# Patient Record
Sex: Female | Born: 1990
Health system: Southern US, Community
[De-identification: ages and names within clinical notes are randomized; demographics above are authoritative.]

## PROBLEM LIST (undated history)

## (undated) ENCOUNTER — Inpatient Hospital Stay: Payer: Self-pay

## (undated) DIAGNOSIS — F17201 Nicotine dependence, unspecified, in remission: Secondary | ICD-10-CM

## (undated) DIAGNOSIS — M419 Scoliosis, unspecified: Secondary | ICD-10-CM

## (undated) DIAGNOSIS — Z8619 Personal history of other infectious and parasitic diseases: Secondary | ICD-10-CM

## (undated) HISTORY — DX: Personal history of other infectious and parasitic diseases: Z86.19

## (undated) HISTORY — DX: Nicotine dependence, unspecified, in remission: F17.201

## (undated) HISTORY — PX: REFRACTIVE SURGERY: SHX103

## (undated) NOTE — *Deleted (*Deleted)
Preventive Care 21-39 Years Old, Female Preventive care refers to visits with your health care provider and lifestyle choices that can promote health and wellness. This includes:  A yearly physical exam. This may also be called an annual well check.  Regular dental visits and eye exams.  Immunizations.  Screening for certain conditions.  Healthy lifestyle choices, such as eating a healthy diet, getting regular exercise, not using drugs or products that contain nicotine and tobacco, and limiting alcohol use. What can I expect for my preventive care visit? Physical exam Your health care provider will check your:  Height and weight. This may be used to calculate body mass index (BMI), which tells if you are at a healthy weight.  Heart rate and blood pressure.  Skin for abnormal spots. Counseling Your health care provider may ask you questions about your:  Alcohol, tobacco, and drug use.  Emotional well-being.  Home and relationship well-being.  Sexual activity.  Eating habits.  Work and work environment.  Method of birth control.  Menstrual cycle.  Pregnancy history. What immunizations do I need?  Influenza (flu) vaccine  This is recommended every year. Tetanus, diphtheria, and pertussis (Tdap) vaccine  You may need a Td booster every 10 years. Varicella (chickenpox) vaccine  You may need this if you have not been vaccinated. Human papillomavirus (HPV) vaccine  If recommended by your health care provider, you may need three doses over 6 months. Measles, mumps, and rubella (MMR) vaccine  You may need at least one dose of MMR. You may also need a second dose. Meningococcal conjugate (MenACWY) vaccine  One dose is recommended if you are age 19-21 years and a first-year college student living in a residence hall, or if you have one of several medical conditions. You may also need additional booster doses. Pneumococcal conjugate (PCV13) vaccine  You may need  this if you have certain conditions and were not previously vaccinated. Pneumococcal polysaccharide (PPSV23) vaccine  You may need one or two doses if you smoke cigarettes or if you have certain conditions. Hepatitis A vaccine  You may need this if you have certain conditions or if you travel or work in places where you may be exposed to hepatitis A. Hepatitis B vaccine  You may need this if you have certain conditions or if you travel or work in places where you may be exposed to hepatitis B. Haemophilus influenzae type b (Hib) vaccine  You may need this if you have certain conditions. You may receive vaccines as individual doses or as more than one vaccine together in one shot (combination vaccines). Talk with your health care provider about the risks and benefits of combination vaccines. What tests do I need?  Blood tests  Lipid and cholesterol levels. These may be checked every 5 years starting at age 20.  Hepatitis C test.  Hepatitis B test. Screening  Diabetes screening. This is done by checking your blood sugar (glucose) after you have not eaten for a while (fasting).  Sexually transmitted disease (STD) testing.  BRCA-related cancer screening. This may be done if you have a family history of breast, ovarian, tubal, or peritoneal cancers.  Pelvic exam and Pap test. This may be done every 3 years starting at age 21. Starting at age 30, this may be done every 5 years if you have a Pap test in combination with an HPV test. Talk with your health care provider about your test results, treatment options, and if necessary, the need for more tests.   Follow these instructions at home: Eating and drinking   Eat a diet that includes fresh fruits and vegetables, whole grains, lean protein, and low-fat dairy.  Take vitamin and mineral supplements as recommended by your health care provider.  Do not drink alcohol if: ? Your health care provider tells you not to drink. ? You are  pregnant, may be pregnant, or are planning to become pregnant.  If you drink alcohol: ? Limit how much you have to 0-1 drink a day. ? Be aware of how much alcohol is in your drink. In the [place] one drink equals one 12 oz bottle of beer (355 mL), one 5 oz glass of wine (148 mL), or one 1 oz glass of hard liquor (44 mL). Lifestyle  Take daily care of your teeth and gums.  Stay active. Exercise for at least 30 minutes on 5 or more days each week.  Do not use any products that contain nicotine or tobacco, such as cigarettes, e-cigarettes, and chewing tobacco. If you need help quitting, ask your health care provider.  If you are sexually active, practice safe sex. Use a condom or other form of birth control (contraception) in order to prevent pregnancy and STIs (sexually transmitted infections). If you plan to become pregnant, see your health care provider for a preconception visit. What's next?  Visit your health care provider once a year for a well check visit.  Ask your health care provider how often you should have your eyes and teeth checked.  Stay up to date on all vaccines. This information is not intended to replace advice given to you by your health care provider. Make sure you discuss any questions you have with your health care provider. Document Revised: 11/19/2017 Document Reviewed: 11/19/2017 Elsevier Patient Education  2020 [employer]. Breast Self-Awareness Breast self-awareness is knowing how your breasts look and feel. Doing breast self-awareness is important. It allows you to catch a breast problem early while it is still small and can be treated. All women should do breast self-awareness, including women who have had breast implants. Tell your doctor if you notice a change in your breasts. What you need:  A mirror.  A well-lit room. How to do a breast self-exam A breast self-exam is one way to learn what is normal for your breasts and to check for changes. To do a  breast self-exam: Look for changes  1. Take off all the clothes above your waist. 2. Stand in front of a mirror in a room with good lighting. 3. Put your hands on your hips. 4. Push your hands down. 5. Look at your breasts and nipples in the mirror to see if one breast or nipple looks different from the other. Check to see if: ? The shape of one breast is different. ? The size of one breast is different. ? There are wrinkles, dips, and bumps in one breast and not the other. 6. Look at each breast for changes in the skin, such as: ? Redness. ? Scaly areas. 7. Look for changes in your nipples, such as: ? Liquid around the nipples. ? Bleeding. ? Dimpling. ? Redness. ? A change in where the nipples are. Feel for changes  1. Lie on your back on the floor. 2. Feel each breast. To do this, follow these steps: ? Pick a breast to feel. ? Put the arm closest to that breast above your head. ? Use your other arm to feel the nipple area of your breast. Feel   the area with the pads of your three middle fingers by making small circles with your fingers. For the first circle, press lightly. For the second circle, press harder. For the third circle, press even harder. ? Keep making circles with your fingers at the different pressures as you move down your breast. Stop when you feel your ribs. ? Move your fingers a little toward the center of your body. ? Start making circles with your fingers again, this time going up until you reach your collarbone. ? Keep making up-and-down circles until you reach your armpit. Remember to keep using the three pressures. ? Feel the other breast in the same way. 3. Sit or stand in the tub or shower. 4. With soapy water on your skin, feel each breast the same way you did in step 2 when you were lying on the floor. Write down what you find Writing down what you find can help you remember what to tell your doctor. Write down:  What is normal for each breast.  Any  changes you find in each breast, including: ? The kind of changes you find. ? Whether you have pain. ? Size and location of any lumps.  When you last had your menstrual period. General tips  Check your breasts every month.  If you are breastfeeding, the best time to check your breasts is after you feed your baby or after you use a breast pump.  If you get menstrual periods, the best time to check your breasts is 5-7 days after your menstrual period is over.  With time, you will become comfortable with the self-exam, and you will begin to know if there are changes in your breasts. Contact a doctor if you:  See a change in the shape or size of your breasts or nipples.  See a change in the skin of your breast or nipples, such as red or scaly skin.  Have fluid coming from your nipples that is not normal.  Find a lump or thick area that was not there before.  Have pain in your breasts.  Have any concerns about your breast health. Summary  Breast self-awareness includes looking for changes in your breasts, as well as feeling for changes within your breasts.  Breast self-awareness should be done in front of a mirror in a well-lit room.  You should check your breasts every month. If you get menstrual periods, the best time to check your breasts is 5-7 days after your menstrual period is over.  Let your doctor know of any changes you see in your breasts, including changes in size, changes on the skin, pain or tenderness, or fluid from your nipples that is not normal. This information is not intended to replace advice given to you by your health care provider. Make sure you discuss any questions you have with your health care provider. Document Revised: 10/27/2017 Document Reviewed: 10/27/2017 Elsevier Patient Education  2020 Elsevier Inc.  

---

## 2011-12-05 ENCOUNTER — Emergency Department: Payer: Self-pay | Admitting: Emergency Medicine

## 2013-07-08 ENCOUNTER — Other Ambulatory Visit: Payer: Self-pay | Admitting: Obstetrics and Gynecology

## 2013-07-08 LAB — HCG, QUANTITATIVE, PREGNANCY: Beta Hcg, Quant.: 16162 m[IU]/mL — ABNORMAL HIGH
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
## 2014-09-21 ENCOUNTER — Telehealth: Payer: Self-pay | Admitting: Obstetrics and Gynecology

## 2014-09-26 ENCOUNTER — Ambulatory Visit (INDEPENDENT_AMBULATORY_CARE_PROVIDER_SITE_OTHER): Payer: 59

## 2014-09-26 ENCOUNTER — Ambulatory Visit: Payer: 59

## 2014-09-26 VITALS — BP 114/79 | HR 92 | Ht 67.0 in | Wt 145.5 lb

## 2014-09-26 DIAGNOSIS — Z3687 Encounter for antenatal screening for uncertain dates: Secondary | ICD-10-CM

## 2014-09-26 DIAGNOSIS — Z720 Tobacco use: Secondary | ICD-10-CM

## 2014-09-26 DIAGNOSIS — Z36 Encounter for antenatal screening of mother: Secondary | ICD-10-CM | POA: Diagnosis not present

## 2014-09-26 DIAGNOSIS — Z3491 Encounter for supervision of normal pregnancy, unspecified, first trimester: Secondary | ICD-10-CM

## 2014-09-26 DIAGNOSIS — N96 Recurrent pregnancy loss: Secondary | ICD-10-CM

## 2014-09-26 LAB — OB RESULTS CONSOLE ABO/RH: RH Type: POSITIVE

## 2014-09-26 NOTE — Progress Notes (Signed)
Monica Wright for NOB nurse interview visit. lmp 08/19/14- (UNSURE) edd -05/26/2014- 5 2/7 G3  P0020-. Pregnancy education material explained and given. NOB labs ordered. HIV consent form signed. PNV encouraged. NT ordered. Pt. to follow up with provider in 5 weeks for NOB physical. Dating scan ordered d/t unsure of LMP. Pt has h/o of 2 sab. Tobacco user. Encouraged to quit. All questions answered.

## 2014-09-27 LAB — CBC WITH DIFFERENTIAL/PLATELET
Basophils Absolute: 0 10*3/uL (ref 0.0–0.2)
Basos: 0 %
EOS (ABSOLUTE): 0.1 10*3/uL (ref 0.0–0.4)
Eos: 1 %
Hematocrit: 40.7 % (ref 34.0–46.6)
Hemoglobin: 14.1 g/dL (ref 11.1–15.9)
Immature Grans (Abs): 0 10*3/uL (ref 0.0–0.1)
Immature Granulocytes: 0 %
Lymphocytes Absolute: 2.2 10*3/uL (ref 0.7–3.1)
Lymphs: 31 %
MCH: 31 pg (ref 26.6–33.0)
MCHC: 34.6 g/dL (ref 31.5–35.7)
MCV: 90 fL (ref 79–97)
Monocytes Absolute: 0.6 10*3/uL (ref 0.1–0.9)
Monocytes: 8 %
Neutrophils Absolute: 4.2 10*3/uL (ref 1.4–7.0)
Neutrophils: 60 %
Platelets: 284 10*3/uL (ref 150–379)
RBC: 4.55 x10E6/uL (ref 3.77–5.28)
RDW: 13.3 % (ref 12.3–15.4)
WBC: 7 10*3/uL (ref 3.4–10.8)

## 2014-09-27 LAB — HIV ANTIBODY (ROUTINE TESTING W REFLEX): HIV Screen 4th Generation wRfx: NONREACTIVE

## 2014-09-27 LAB — RPR: RPR Ser Ql: NONREACTIVE

## 2014-09-27 LAB — ABO AND RH: Rh Factor: POSITIVE

## 2014-09-27 LAB — URINE CULTURE

## 2014-09-27 LAB — ANTIBODY SCREEN: Antibody Screen: NEGATIVE

## 2014-09-27 LAB — RUBELLA SCREEN: Rubella Antibodies, IGG: 2.98 index (ref >0.99)

## 2014-09-27 LAB — URINALYSIS, ROUTINE W REFLEX MICROSCOPIC
Bilirubin, UA: NEGATIVE
Glucose, UA: NEGATIVE
Ketones, UA: NEGATIVE
Leukocytes, UA: NEGATIVE
Nitrite, UA: NEGATIVE
Protein, UA: NEGATIVE
RBC, UA: NEGATIVE
Specific Gravity, UA: 1.007 (ref 1.005–1.030)
Urobilinogen, Ur: 0.2 mg/dL (ref 0.2–1.0)
pH, UA: 6.5 (ref 5.0–7.5)

## 2014-09-27 LAB — HEPATITIS B SURFACE ANTIGEN: Hepatitis B Surface Ag: NEGATIVE

## 2014-09-27 LAB — VARICELLA ZOSTER ANTIBODY, IGG: Varicella zoster IgG: <135 index — ABNORMAL LOW (ref >165)

## 2014-09-28 LAB — GC/CHLAMYDIA PROBE AMP
Chlamydia trachomatis, NAA: POSITIVE — AB
Neisseria gonorrhoeae by PCR: NEGATIVE

## 2014-09-29 MED ORDER — AZITHROMYCIN 500 MG PO TABS: 1000.0000 mg | ORAL_TABLET | Freq: Once | ORAL | Status: DC

## 2014-09-29 NOTE — Telephone Encounter (Signed)
Pt notified and rx sent in 

## 2014-09-29 NOTE — Telephone Encounter (Signed)
-----   Message from Hildred LaserAnika Cherry, MD sent at 09/29/2014 12:39 PM EDT ----- Please inform patient of positive Chlamydia, and need for self and partner treatment.  Can prescribe Azithromycin 1 g (1 refill in case patient has vomiting).

## 2014-10-01 DIAGNOSIS — Z8619 Personal history of other infectious and parasitic diseases: Secondary | ICD-10-CM | POA: Insufficient documentation

## 2014-10-01 HISTORY — DX: Personal history of other infectious and parasitic diseases: Z86.19

## 2014-10-05 ENCOUNTER — Encounter: Payer: Self-pay | Admitting: Obstetrics and Gynecology

## 2014-10-31 ENCOUNTER — Encounter: Payer: 59 | Admitting: Obstetrics and Gynecology

## 2014-11-01 ENCOUNTER — Ambulatory Visit (INDEPENDENT_AMBULATORY_CARE_PROVIDER_SITE_OTHER): Payer: 59 | Admitting: Obstetrics and Gynecology

## 2014-11-01 ENCOUNTER — Encounter: Payer: Self-pay | Admitting: Obstetrics and Gynecology

## 2014-11-01 VITALS — BP 108/70 | HR 94 | Wt 147.4 lb

## 2014-11-01 DIAGNOSIS — O2620 Pregnancy care for patient with recurrent pregnancy loss, unspecified trimester: Secondary | ICD-10-CM | POA: Insufficient documentation

## 2014-11-01 DIAGNOSIS — O09291 Supervision of pregnancy with other poor reproductive or obstetric history, first trimester: Secondary | ICD-10-CM

## 2014-11-01 DIAGNOSIS — Z3491 Encounter for supervision of normal pregnancy, unspecified, first trimester: Secondary | ICD-10-CM

## 2014-11-01 DIAGNOSIS — A749 Chlamydial infection, unspecified: Secondary | ICD-10-CM

## 2014-11-01 DIAGNOSIS — O09891 Supervision of other high risk pregnancies, first trimester: Secondary | ICD-10-CM

## 2014-11-01 DIAGNOSIS — O2621 Pregnancy care for patient with recurrent pregnancy loss, first trimester: Secondary | ICD-10-CM

## 2014-11-01 LAB — POCT URINALYSIS DIPSTICK
Bilirubin, UA: NEGATIVE
Blood, UA: NEGATIVE
Glucose, UA: NEGATIVE
Ketones, UA: NEGATIVE
Nitrite, UA: NEGATIVE
Protein, UA: NEGATIVE
Spec Grav, UA: 1.01
Urobilinogen, UA: 0.2
pH, UA: 7.5

## 2014-11-01 NOTE — Progress Notes (Signed)
No complaints. Need to order NT today.

## 2014-11-01 NOTE — Progress Notes (Signed)
Subjective:    Monica Wright is being seen today for her first obstetrical visit.  This is a planned pregnancy. She is at [redacted]w[redacted]d gestation. Patient's last menstrual period was 08/19/2014 (approximate).  Estimated Date of Delivery: 05/26/15.  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 Relationship with FOB: spouse, living together. Patient does intend to breast feed. Pregnancy history fully reviewed.  Menstrual History: Obstetric History   G3   P0   T0   P0   A2   TAB0   SAB2   E0   M0   L0     # Outcome Date GA Lbr Len/2nd Weight Sex Delivery Anes PTL Lv  3 Current           2 SAB 2015 [redacted]w[redacted]d         1 SAB 2009 [redacted]w[redacted]d             Menarche age: 54  Denies h/o abnormal pap smears. Last pap 01/2014, normal.  H/o chlamydia infection diagnosed 09/2014. Reports self and partner treatment.      Past Medical History  Diagnosis Date  . Medical history non-contributory     Past Surgical History  Procedure Laterality Date  . No past surgeries      Family History  Problem Relation Age of Onset  . Heart disease Neg Hx   . Drug abuse Neg Hx   . Cancer Neg Hx     Social History   Social History  . Marital Status: Single    Spouse Name: N/A  . Number of Children: N/A  . Years of Education: N/A   Occupational History  . Not on file.   Social History Main Topics  . Smoking status: Light Tobacco Smoker -- 0.25 packs/day    Types: Cigarettes  . Smokeless tobacco: Not on file  . Alcohol Use: No  . Drug Use: No  . Sexual Activity: Yes    Birth Control/ Protection: None   Other Topics Concern  . Not on file   Social History Narrative    Current Outpatient Prescriptions on File Prior to Visit  Medication Sig Dispense Refill  . Prenatal Vit-Fe Fumarate-FA (MULTIVITAMIN-PRENATAL) 27-0.8 MG TABS tablet Take 1 tablet by mouth daily at 12 noon.      No Known Allergies    Review of Systems General:Not Present- Fever, Weight Loss and Weight Gain. Skin:Not  Present- Rash. HEENT:Not Present- Blurred Vision, Headache and Bleeding Gums. Respiratory:Not Present- Difficulty Breathing. Breast:Not Present- Breast Mass. Cardiovascular:Not Present- Chest Pain, Elevated Blood Pressure, Fainting / Blacking Out and Shortness of Breath. Gastrointestinal:Not Present- Abdominal Pain, Constipation, Nausea and Vomiting. Female Genitourinary:Not Present- Frequency, Painful Urination, Pelvic Pain, Vaginal Bleeding, Vaginal Discharge, Contractions, regular, Fetal Movements Decreased, Urinary Complaints and Vaginal Fluid. Musculoskeletal:Not Present- Back Pain and Leg Cramps. Neurological:Not Present- Dizziness. Psychiatric:Not Present- Depression.     Objective:  Blood pressure 108/70, pulse 94, weight 147 lb 6.4 oz (66.86 kg), last menstrual period 08/19/2014.  General Appearance:    Alert, cooperative, no distress, appears stated age  Head:    Normocephalic, without obvious abnormality, atraumatic  Eyes:    PERRL, conjunctiva/corneas clear, EOM's intact, both eyes  Ears:    Normal external ear canals, both ears  Nose:   Nares normal, septum midline, mucosa normal, no drainage or sinus tenderness  Throat:   Lips, mucosa, and tongue normal; teeth and gums normal  Neck:   Supple, symmetrical, trachea midline, no adenopathy; thyroid: no enlargement/tenderness/nodules; no carotid bruit or  JVD  Back:     Symmetric, no curvature, ROM normal, no CVA tenderness  Lungs:     Clear to auscultation bilaterally, respirations unlabored  Chest Wall:    No tenderness or deformity   Heart:    Regular rate and rhythm, S1 and S2 normal, no murmur, rub or gallop  Breast Exam:    No tenderness, masses, or nipple abnormality  Abdomen:     Soft, non-tender, bowel sounds active all four quadrants, no masses, no organomegaly.  FHT 166 bpm.  Genitalia:    Pelvic:external genitalia normal, vagina with lesions, discharge, or tenderness, rectovaginal septum  normal. Cervix normal  in appearance, no cervical motion tenderness, no adnexal masses or tenderness.  Pregnancy positive findings: uterine enlargement:10-11 wk size, nontender.   Rectal:    Normal external sphincter.  No hemorrhoids appreciated. Internal exam not done.   Extremities:   Extremities normal, atraumatic, no cyanosis or edema  Pulses:   2+ and symmetric all extremities  Skin:   Skin color, texture, turgor normal, no rashes or lesions  Lymph nodes:   Cervical, supraclavicular, and axillary nodes normal  Neurologic:   CNII-XII intact, normal strength, sensation and reflexes throughout     Assessment:   Pregnancy at 10 and 4/7 weeks  Recent chlamydia infection, s/p self and partner treatment H/o recurrent miscarriages.    Plan:    Initial labs reviewed. Varicella non-immune.  Will need Varicella vaccine postpartum.  Prenatal vitamins. Problem list reviewed and updated. New OB counseling: The patient has been given an overview regarding routine prenatal care. Recommendations regarding diet, weight gain, and exercise in pregnancy were given. Prenatal testing, optional genetic testing, and ultrasound use in pregnancy were reviewed.  Patient desires Panorama testing.  Benefits of Breast Feeding were discussed. The patient is encouraged to consider nursing her baby post partum. Needs test of cure for recent chlamydia infection.   SAB precautions given.  Follow up in 4 weeks.   [name], MD [facility]

## 2014-11-02 LAB — GC/CHLAMYDIA PROBE AMP
Chlamydia trachomatis, NAA: NEGATIVE
Neisseria gonorrhoeae by PCR: NEGATIVE

## 2014-11-09 ENCOUNTER — Ambulatory Visit: Payer: Self-pay | Admitting: Obstetrics and Gynecology

## 2014-11-16 ENCOUNTER — Encounter: Payer: Self-pay | Admitting: Obstetrics and Gynecology

## 2014-11-30 ENCOUNTER — Ambulatory Visit (INDEPENDENT_AMBULATORY_CARE_PROVIDER_SITE_OTHER): Payer: 59 | Admitting: Obstetrics and Gynecology

## 2014-11-30 VITALS — BP 96/61 | HR 84 | Wt 150.8 lb

## 2014-11-30 DIAGNOSIS — O09892 Supervision of other high risk pregnancies, second trimester: Secondary | ICD-10-CM

## 2014-11-30 DIAGNOSIS — O09292 Supervision of pregnancy with other poor reproductive or obstetric history, second trimester: Secondary | ICD-10-CM

## 2014-11-30 DIAGNOSIS — Z3402 Encounter for supervision of normal first pregnancy, second trimester: Secondary | ICD-10-CM

## 2014-11-30 LAB — POCT URINALYSIS DIPSTICK
Bilirubin, UA: NEGATIVE
Blood, UA: NEGATIVE
Glucose, UA: NEGATIVE
Ketones, UA: NEGATIVE
Leukocytes, UA: NEGATIVE
Nitrite, UA: NEGATIVE
Protein, UA: NEGATIVE
Spec Grav, UA: 1.015
Urobilinogen, UA: NEGATIVE
pH, UA: 7.5

## 2014-11-30 NOTE — Progress Notes (Signed)
ROB: Denies complaints. Doing well.  Panorama normal screen.  TOC neg for chlamydia. RTC in 4 weeks.

## 2014-12-15 ENCOUNTER — Telehealth: Payer: Self-pay | Admitting: Obstetrics and Gynecology

## 2014-12-15 NOTE — Telephone Encounter (Signed)
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 Not red or painful. She has been elevating her legs above her heart since she got home and that seems to help. NO h/a, dizziness, or blurred vision. She is up on her feet a lot at work. Advised to stay hydrated and elevated legs when possible. Monitor for other sx.

## 2014-12-15 NOTE — Telephone Encounter (Signed)
[redacted] wks pregnant and has swelling in both legs, from feet to half way to knee. Quite a bit swelling

## 2014-12-28 ENCOUNTER — Ambulatory Visit (INDEPENDENT_AMBULATORY_CARE_PROVIDER_SITE_OTHER): Payer: 59 | Admitting: Obstetrics and Gynecology

## 2014-12-28 VITALS — BP 128/68 | HR 71 | Wt 156.6 lb

## 2014-12-28 DIAGNOSIS — Z283 Underimmunization status: Secondary | ICD-10-CM

## 2014-12-28 DIAGNOSIS — Z23 Encounter for immunization: Secondary | ICD-10-CM

## 2014-12-28 DIAGNOSIS — Z3493 Encounter for supervision of normal pregnancy, unspecified, third trimester: Secondary | ICD-10-CM

## 2014-12-28 DIAGNOSIS — O09899 Supervision of other high risk pregnancies, unspecified trimester: Secondary | ICD-10-CM | POA: Insufficient documentation

## 2014-12-28 DIAGNOSIS — Z3402 Encounter for supervision of normal first pregnancy, second trimester: Secondary | ICD-10-CM | POA: Diagnosis not present

## 2014-12-28 DIAGNOSIS — Z3490 Encounter for supervision of normal pregnancy, unspecified, unspecified trimester: Secondary | ICD-10-CM | POA: Insufficient documentation

## 2014-12-28 DIAGNOSIS — O2622 Pregnancy care for patient with recurrent pregnancy loss, second trimester: Secondary | ICD-10-CM

## 2014-12-28 LAB — POCT URINALYSIS DIP (MANUAL ENTRY)
Bilirubin, UA: NEGATIVE
Blood, UA: NEGATIVE
Glucose, UA: NEGATIVE
Ketones, POC UA: NEGATIVE
Leukocytes, UA: NEGATIVE
Nitrite, UA: NEGATIVE
Protein Ur, POC: NEGATIVE
Spec Grav, UA: 1.015
Urobilinogen, UA: 0.2
pH, UA: 6

## 2014-12-28 NOTE — Progress Notes (Signed)
ROB: Patient doing well. Denies complaints.  Has cut down cigarettes to 1 cig every 2 weeks or so (down from 1/4 ppd).  Is still working on cessation.  Continued to offer encouragement. Flu vaccine given today. For anatomy scan in 2 weeks, RTC in 4 weeks.

## 2015-01-09 ENCOUNTER — Ambulatory Visit: Payer: 59

## 2015-01-09 DIAGNOSIS — Z3402 Encounter for supervision of normal first pregnancy, second trimester: Secondary | ICD-10-CM | POA: Diagnosis not present

## 2015-01-11 ENCOUNTER — Observation Stay
Admission: EM | Admit: 2015-01-11 | Discharge: 2015-01-11 | Disposition: A | Discharge status: Home / Self Care | Payer: 59 | Attending: Obstetrics and Gynecology | Admitting: Obstetrics and Gynecology

## 2015-01-11 DIAGNOSIS — Z3A20 20 weeks gestation of pregnancy: Secondary | ICD-10-CM | POA: Diagnosis not present

## 2015-01-11 DIAGNOSIS — N949 Unspecified condition associated with female genital organs and menstrual cycle: Secondary | ICD-10-CM

## 2015-01-11 LAB — URINALYSIS COMPLETE WITH MICROSCOPIC (ARMC ONLY)
Bilirubin Urine: NEGATIVE
Glucose, UA: NEGATIVE mg/dL
Ketones, ur: NEGATIVE mg/dL
Nitrite: NEGATIVE
Protein, ur: 30 mg/dL — AB
Specific Gravity, Urine: 1.028 (ref 1.005–1.030)
pH: 5 (ref 5.0–8.0)

## 2015-01-11 NOTE — Plan of Care (Signed)
Urinalysis results reported to dr cherry. Discharge orders received. D/c instructions given to pt. 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 Pt states she would like to go home . Pt encouraged to drink at least 8 big glasses water /day. call dr's office if needs a note for work. Follow up appointment in 2 weeks

## 2015-01-22 ENCOUNTER — Telehealth: Payer: Self-pay | Admitting: Obstetrics and Gynecology

## 2015-01-22 NOTE — Telephone Encounter (Signed)
US RESULTS ... JUST WANTS TO MAKE SURE EVERYTHING IS OK. PLEASE CALL HER.

## 2015-01-23 NOTE — Telephone Encounter (Signed)
Pt notified US results normal.

## 2015-01-24 ENCOUNTER — Encounter: Payer: 59 | Admitting: Obstetrics and Gynecology

## 2015-01-31 ENCOUNTER — Ambulatory Visit (INDEPENDENT_AMBULATORY_CARE_PROVIDER_SITE_OTHER): Payer: 59 | Admitting: Obstetrics and Gynecology

## 2015-01-31 VITALS — BP 105/64 | HR 83 | Wt 156.9 lb

## 2015-01-31 DIAGNOSIS — F17201 Nicotine dependence, unspecified, in remission: Secondary | ICD-10-CM

## 2015-01-31 DIAGNOSIS — Z3492 Encounter for supervision of normal pregnancy, unspecified, second trimester: Secondary | ICD-10-CM

## 2015-01-31 HISTORY — DX: Nicotine dependence, unspecified, in remission: F17.201

## 2015-01-31 NOTE — Progress Notes (Signed)
ROB: Patient doing well, no complaints. Notes that she has stopped smoking since 3 weeks ago.  RTC in 4 weeks. For glucola, Tdap, blood consent at that time.  Patient unable to void today.

## 2015-02-28 ENCOUNTER — Ambulatory Visit (INDEPENDENT_AMBULATORY_CARE_PROVIDER_SITE_OTHER): Payer: 59 | Admitting: Obstetrics and Gynecology

## 2015-02-28 VITALS — BP 112/71 | HR 96 | Wt 160.6 lb

## 2015-02-28 DIAGNOSIS — Z3483 Encounter for supervision of other normal pregnancy, third trimester: Secondary | ICD-10-CM | POA: Diagnosis not present

## 2015-02-28 DIAGNOSIS — Z23 Encounter for immunization: Secondary | ICD-10-CM

## 2015-02-28 DIAGNOSIS — Z3493 Encounter for supervision of normal pregnancy, unspecified, third trimester: Secondary | ICD-10-CM

## 2015-02-28 DIAGNOSIS — Z131 Encounter for screening for diabetes mellitus: Secondary | ICD-10-CM

## 2015-02-28 LAB — POCT URINALYSIS DIPSTICK
Bilirubin, UA: NEGATIVE
Blood, UA: NEGATIVE
Glucose, UA: NEGATIVE
Ketones, UA: NEGATIVE
Leukocytes, UA: NEGATIVE
Nitrite, UA: NEGATIVE
Protein, UA: NEGATIVE
Spec Grav, UA: 1.01
Urobilinogen, UA: NEGATIVE
pH, UA: 7

## 2015-02-28 NOTE — Progress Notes (Signed)
ROB: Denies complaints. Doing well.  For glucola, Tdap, blood consent today.  RTC in 2 weeks.

## 2015-03-01 ENCOUNTER — Telehealth: Payer: Self-pay

## 2015-03-01 LAB — HEMOGLOBIN AND HEMATOCRIT, BLOOD
Hematocrit: 36 % (ref 34.0–46.6)
Hemoglobin: 12.2 g/dL (ref 11.1–15.9)

## 2015-03-01 LAB — GLUCOSE TOLERANCE, 1 HOUR: Glucose, 1Hr PP: 180 mg/dL (ref 65–199)

## 2015-03-01 NOTE — Telephone Encounter (Signed)
LM for pt to call back.

## 2015-03-01 NOTE — Telephone Encounter (Signed)
Pt called and was returning your call about the results.

## 2015-03-01 NOTE — Telephone Encounter (Signed)
-----   Message from Monica LaserAnika Cherry, MD sent at 03/01/2015  8:24 AM EST ----- Please call to inform patient of abnormal glucola (1 hr).  Needs 3 hr glucola.

## 2015-03-06 ENCOUNTER — Other Ambulatory Visit: Payer: Self-pay | Admitting: Obstetrics and Gynecology

## 2015-03-06 ENCOUNTER — Other Ambulatory Visit: Payer: 59

## 2015-03-06 ENCOUNTER — Other Ambulatory Visit: Payer: Self-pay

## 2015-03-06 DIAGNOSIS — Z131 Encounter for screening for diabetes mellitus: Secondary | ICD-10-CM

## 2015-03-06 DIAGNOSIS — R7309 Other abnormal glucose: Secondary | ICD-10-CM

## 2015-03-08 LAB — GESTATIONAL GLUCOSE TOLERANCE
Glucose, Fasting: 84 mg/dL (ref 65–94)
Glucose, GTT - 1 Hour: 135 mg/dL (ref 65–179)
Glucose, GTT - 2 Hour: 106 mg/dL (ref 65–154)
Glucose, GTT - 3 Hour: 111 mg/dL (ref 65–139)

## 2015-03-14 ENCOUNTER — Encounter: Payer: Self-pay | Admitting: Obstetrics and Gynecology

## 2015-03-14 ENCOUNTER — Ambulatory Visit (INDEPENDENT_AMBULATORY_CARE_PROVIDER_SITE_OTHER): Payer: 59 | Admitting: Certified Nurse Midwife

## 2015-03-14 VITALS — BP 140/70 | HR 101 | Wt 156.0 lb

## 2015-03-14 DIAGNOSIS — Z2839 Other underimmunization status: Secondary | ICD-10-CM

## 2015-03-14 DIAGNOSIS — Z3492 Encounter for supervision of normal pregnancy, unspecified, second trimester: Secondary | ICD-10-CM

## 2015-03-14 DIAGNOSIS — Z283 Underimmunization status: Secondary | ICD-10-CM

## 2015-03-14 DIAGNOSIS — O09899 Supervision of other high risk pregnancies, unspecified trimester: Secondary | ICD-10-CM

## 2015-03-14 DIAGNOSIS — Z789 Other specified health status: Secondary | ICD-10-CM

## 2015-03-14 DIAGNOSIS — Z3482 Encounter for supervision of other normal pregnancy, second trimester: Secondary | ICD-10-CM

## 2015-03-14 LAB — POCT URINALYSIS DIPSTICK
Bilirubin, UA: NEGATIVE
Blood, UA: NEGATIVE
Glucose, UA: NEGATIVE
Ketones, UA: NEGATIVE
Leukocytes, UA: NEGATIVE
Nitrite, UA: NEGATIVE
Protein, UA: NEGATIVE
Spec Grav, UA: 1.01
Urobilinogen, UA: 0.2
pH, UA: 7.5

## 2015-03-14 NOTE — Progress Notes (Signed)
No complaints

## 2015-03-14 NOTE — Progress Notes (Signed)
ROB-Reviewed 3 hour WNL  Girl "Monica Wright"

## 2015-03-25 HISTORY — PX: WISDOM TOOTH EXTRACTION: SHX21

## 2015-03-25 NOTE — L&D Delivery Note (Signed)
Delivery Summary for Monica Wright  Labor Events:   Preterm labor:   Rupture date:   Rupture time:   Rupture type: Bulging bag of water  Fluid Color:   Induction:   Augmentation:   Complications:   Cervical ripening:          Delivery:   Episiotomy:   Lacerations:   Repair suture:   Repair # of packets:   Blood loss (ml): 200   Information for the patient's newborn:  Alexxus, Sobh [[id number]]    Delivery 05/18/2015 3:18 AM by  Vaginal, Spontaneous Delivery Sex:  female Gestational Age: [redacted]w[redacted]d Delivery Clinician:  [name] Living?: Yes        APGARS  One minute Five minutes Ten minutes  Skin color: 0   1      Heart rate: 2   2      Grimace: 2   2      Muscle tone: 2   2      Breathing: 2   2      Totals: 8  9      Presentation/position: Vertex  Left Occiput Anterior Resuscitation: None  Cord information: 3 vessels   Disposition of cord blood: No    Blood gases sent? No Complications: None  Placenta: Delivered: 05/18/2015 3:22 AM  Spontaneous  Intact appearance Newborn Measurements: Weight: 8 lb 0.4 oz (3640 g)  Height: 20.87"  Head circumference:    Chest circumference:    Other providers: Delivery Nurse Transition RN Heather F Creasey Cyndi Bender  Additional  information: Forceps:   Vacuum:   Breech:   Observed anomalies       Delivery Note At 3:18 AM a viable and healthy female was delivered via Vaginal, Spontaneous Delivery (Presentation: Vertex; Left Occiput Anterior).  APGAR: 8, 9; weight 8 lb 0.4 oz (3640 g).   Placenta status: Intact, Spontaneous.  Cord: 3 vessels with the following complications: None.  Cord pH: none  Anesthesia: Epidural  Episiotomy: None Lacerations: 1st degree periurethral (bilateral) Suture Repair: 3.0 Est. Blood Loss (mL): 200  Mom to postpartum.  Baby to Couplet care / Skin to Skin.  [name] 05/18/2015, 10:13 AM

## 2015-03-28 ENCOUNTER — Ambulatory Visit (INDEPENDENT_AMBULATORY_CARE_PROVIDER_SITE_OTHER): Payer: 59 | Admitting: Obstetrics and Gynecology

## 2015-03-28 ENCOUNTER — Encounter: Payer: Self-pay | Admitting: Obstetrics and Gynecology

## 2015-03-28 VITALS — BP 115/69 | HR 94 | Wt 165.9 lb

## 2015-03-28 DIAGNOSIS — Z3493 Encounter for supervision of normal pregnancy, unspecified, third trimester: Secondary | ICD-10-CM

## 2015-03-28 DIAGNOSIS — Z3483 Encounter for supervision of other normal pregnancy, third trimester: Secondary | ICD-10-CM

## 2015-03-28 LAB — POCT URINALYSIS DIPSTICK
Bilirubin, UA: NEGATIVE
Blood, UA: NEGATIVE
Glucose, UA: NEGATIVE
Ketones, UA: NEGATIVE
Leukocytes, UA: NEGATIVE
Nitrite, UA: NEGATIVE
Protein, UA: NEGATIVE
Spec Grav, UA: 1.01
Urobilinogen, UA: NEGATIVE
pH, UA: 7

## 2015-03-28 NOTE — Progress Notes (Signed)
Patient doing well, denies complaints. Doing well.  To f/u in 2 weeks. PTL precautions given.

## 2015-04-11 ENCOUNTER — Ambulatory Visit (INDEPENDENT_AMBULATORY_CARE_PROVIDER_SITE_OTHER): Payer: 59 | Admitting: Obstetrics and Gynecology

## 2015-04-11 VITALS — BP 114/74 | HR 92 | Wt 169.3 lb

## 2015-04-11 DIAGNOSIS — Z3493 Encounter for supervision of normal pregnancy, unspecified, third trimester: Secondary | ICD-10-CM

## 2015-04-11 DIAGNOSIS — Z3483 Encounter for supervision of other normal pregnancy, third trimester: Secondary | ICD-10-CM

## 2015-04-11 LAB — POCT URINALYSIS DIPSTICK
Bilirubin, UA: NEGATIVE
Blood, UA: NEGATIVE
Glucose, UA: NEGATIVE
Ketones, UA: NEGATIVE
Nitrite, UA: NEGATIVE
Protein, UA: NEGATIVE
Spec Grav, UA: 1.02
Urobilinogen, UA: NEGATIVE
pH, UA: 6.5

## 2015-04-11 NOTE — Progress Notes (Signed)
ROB: Patient doing well, no complaints. Unsure about contraception (likely will go back to Depo Provera).  Desires to breastfeed.

## 2015-04-25 ENCOUNTER — Ambulatory Visit (INDEPENDENT_AMBULATORY_CARE_PROVIDER_SITE_OTHER): Payer: 59 | Admitting: Obstetrics and Gynecology

## 2015-04-25 VITALS — BP 125/72 | HR 101 | Wt 173.3 lb

## 2015-04-25 DIAGNOSIS — Z3483 Encounter for supervision of other normal pregnancy, third trimester: Secondary | ICD-10-CM

## 2015-04-25 DIAGNOSIS — Z36 Encounter for antenatal screening of mother: Secondary | ICD-10-CM

## 2015-04-25 DIAGNOSIS — Z113 Encounter for screening for infections with a predominantly sexual mode of transmission: Secondary | ICD-10-CM

## 2015-04-25 DIAGNOSIS — Z3493 Encounter for supervision of normal pregnancy, unspecified, third trimester: Secondary | ICD-10-CM

## 2015-04-25 DIAGNOSIS — Z3685 Encounter for antenatal screening for Streptococcus B: Secondary | ICD-10-CM

## 2015-04-25 LAB — POCT URINALYSIS DIPSTICK
Bilirubin, UA: NEGATIVE
Blood, UA: NEGATIVE
Glucose, UA: NEGATIVE
Ketones, UA: NEGATIVE
Nitrite, UA: NEGATIVE
Protein, UA: NEGATIVE
Spec Grav, UA: <=1.005
Urobilinogen, UA: NEGATIVE
pH, UA: 7

## 2015-04-25 NOTE — Progress Notes (Signed)
ROB: Patient doing well.  Notes occasional cramping.  36 week labs done today.  PTL precautions given.  RTC in 1 week.

## 2015-04-26 ENCOUNTER — Observation Stay
Admission: EM | Admit: 2015-04-26 | Discharge: 2015-04-26 | Disposition: A | Discharge status: Home / Self Care | Payer: 59 | Attending: Obstetrics and Gynecology | Admitting: Obstetrics and Gynecology

## 2015-04-26 DIAGNOSIS — Z3A35 35 weeks gestation of pregnancy: Secondary | ICD-10-CM | POA: Insufficient documentation

## 2015-04-26 NOTE — Progress Notes (Signed)
L&D OB Triage Note  Monica Wright is a 25 y.o. G23P0020 female at [redacted]w[redacted]d, EDD Estimated Date of Delivery: 05/26/15 who presented to triage for complaints of loss of mucus plug.  Denied contractions, vaginal bleeding, LOF, or decreased fetal movement.  She was evaluated by the nurses with no other significant findings. Vital signs stable. An NST was performed and has been reviewed by MD.   NST INTERPRETATION: Indications: patient reassurance and rule out uterine contractions  Mode: External Baseline Rate (A): 140 bpm Variability: Moderate Accelerations: 15 x 15 Decelerations: None Nonstress Test Interpretation: Reactive Overall Impression: Reassuring for gestational age Contraction Frequency (min): occasional  Impression: reactive   Plan: NST performed was reviewed and was found to be reactive. She was discharged home with bleeding/labor precautions.  Continue routine prenatal care. Follow up with OB/GYN as previously scheduled.     [name], MD

## 2015-04-26 NOTE — Discharge Summary (Signed)
Patient received discharge instructions, follow up appointments, braxton hicks contractions, and when to seek medical attention. Patient ambulating with a steady gait at discharge with no complaints.

## 2015-04-29 LAB — CULTURE, BETA STREP (GROUP B ONLY): Strep Gp B Culture: NEGATIVE

## 2015-05-02 ENCOUNTER — Ambulatory Visit (INDEPENDENT_AMBULATORY_CARE_PROVIDER_SITE_OTHER): Payer: 59 | Admitting: Obstetrics and Gynecology

## 2015-05-02 VITALS — BP 102/70 | HR 78 | Wt 175.1 lb

## 2015-05-02 DIAGNOSIS — Z3493 Encounter for supervision of normal pregnancy, unspecified, third trimester: Secondary | ICD-10-CM

## 2015-05-02 DIAGNOSIS — Z3483 Encounter for supervision of other normal pregnancy, third trimester: Secondary | ICD-10-CM

## 2015-05-02 DIAGNOSIS — Z113 Encounter for screening for infections with a predominantly sexual mode of transmission: Secondary | ICD-10-CM | POA: Diagnosis not present

## 2015-05-02 LAB — GC/CHLAMYDIA PROBE AMP

## 2015-05-02 LAB — POCT URINALYSIS DIPSTICK
Bilirubin, UA: NEGATIVE
Blood, UA: NEGATIVE
Glucose, UA: NEGATIVE
Ketones, UA: NEGATIVE
Nitrite, UA: NEGATIVE
Protein, UA: NEGATIVE
Spec Grav, UA: 1.01
Urobilinogen, UA: NEGATIVE
pH, UA: 7

## 2015-05-02 NOTE — Progress Notes (Signed)
ROB: Patient doing well.  Notes occasional Monica Wright.  36 week labs with GBS neg, GC/Cl unable to be resulted.  Will repeat today.  PTL precautions given.  RTC in 1 week.

## 2015-05-02 NOTE — Addendum Note (Signed)
Addended by: Frankey Shown on: 05/02/2015 09:41 AM   Modules accepted: Orders

## 2015-05-06 LAB — GC/CHLAMYDIA PROBE AMP
Chlamydia trachomatis, NAA: NEGATIVE
Neisseria gonorrhoeae by PCR: NEGATIVE

## 2015-05-09 ENCOUNTER — Ambulatory Visit (INDEPENDENT_AMBULATORY_CARE_PROVIDER_SITE_OTHER): Payer: 59 | Admitting: Obstetrics and Gynecology

## 2015-05-09 VITALS — BP 112/75 | HR 98 | Wt 176.5 lb

## 2015-05-09 DIAGNOSIS — Z3483 Encounter for supervision of other normal pregnancy, third trimester: Secondary | ICD-10-CM

## 2015-05-09 DIAGNOSIS — Z3493 Encounter for supervision of normal pregnancy, unspecified, third trimester: Secondary | ICD-10-CM

## 2015-05-09 LAB — POCT URINALYSIS DIPSTICK
Bilirubin, UA: NEGATIVE
Blood, UA: NEGATIVE
Glucose, UA: NEGATIVE
Ketones, UA: NEGATIVE
Leukocytes, UA: NEGATIVE
Nitrite, UA: NEGATIVE
Protein, UA: NEGATIVE
Spec Grav, UA: <=1.005
Urobilinogen, UA: NEGATIVE
pH, UA: 7

## 2015-05-09 NOTE — Progress Notes (Signed)
ROB: Patient doing well,  Notes occasional groin pain.  Still also noting [facility].  GC/Cl neg.  Labor precautiong reiterated.  RTC in 1 week.

## 2015-05-15 ENCOUNTER — Encounter: Payer: 59 | Admitting: Obstetrics and Gynecology

## 2015-05-16 ENCOUNTER — Ambulatory Visit (INDEPENDENT_AMBULATORY_CARE_PROVIDER_SITE_OTHER): Payer: 59 | Admitting: Obstetrics and Gynecology

## 2015-05-16 VITALS — BP 111/75 | HR 112 | Wt 175.2 lb

## 2015-05-16 DIAGNOSIS — Z3493 Encounter for supervision of normal pregnancy, unspecified, third trimester: Secondary | ICD-10-CM

## 2015-05-16 DIAGNOSIS — Z3483 Encounter for supervision of other normal pregnancy, third trimester: Secondary | ICD-10-CM

## 2015-05-16 LAB — POCT URINALYSIS DIPSTICK
Bilirubin, UA: NEGATIVE
Blood, UA: NEGATIVE
Glucose, UA: NEGATIVE
Ketones, UA: NEGATIVE
Leukocytes, UA: NEGATIVE
Nitrite, UA: NEGATIVE
Protein, UA: NEGATIVE
Spec Grav, UA: <=1.005
Urobilinogen, UA: NEGATIVE
pH, UA: 7

## 2015-05-16 NOTE — Progress Notes (Signed)
ROB: Patient doing well, notes occasional contractions.  Labor precautions reiterated.  RTC in 1 week.

## 2015-05-17 ENCOUNTER — Encounter: Payer: Self-pay | Admitting: *Deleted

## 2015-05-17 ENCOUNTER — Inpatient Hospital Stay
Admit: 2015-05-17 | Discharge: 2015-05-19 | DRG: 775 | Disposition: A | Discharge status: Home / Self Care | Payer: 59 | Attending: Obstetrics and Gynecology | Admitting: Obstetrics and Gynecology

## 2015-05-17 ENCOUNTER — Observation Stay
Admission: EM | Admit: 2015-05-17 | Discharge: 2015-05-17 | Disposition: A | Discharge status: Home / Self Care | Payer: 59 | Source: Home / Self Care | Admitting: Obstetrics and Gynecology

## 2015-05-17 DIAGNOSIS — Z3A38 38 weeks gestation of pregnancy: Secondary | ICD-10-CM | POA: Diagnosis not present

## 2015-05-17 DIAGNOSIS — IMO0001 Reserved for inherently not codable concepts without codable children: Secondary | ICD-10-CM

## 2015-05-17 DIAGNOSIS — Z3493 Encounter for supervision of normal pregnancy, unspecified, third trimester: Secondary | ICD-10-CM | POA: Diagnosis not present

## 2015-05-17 DIAGNOSIS — O479 False labor, unspecified: Secondary | ICD-10-CM | POA: Diagnosis present

## 2015-05-17 DIAGNOSIS — Z23 Encounter for immunization: Secondary | ICD-10-CM

## 2015-05-17 HISTORY — DX: Scoliosis, unspecified: M41.9

## 2015-05-17 NOTE — OB Triage Note (Signed)
Contractions woke patient around 4 AM this morning. Noticed "bloody show" x2 today. Denies leaking fluid, or decreased fetal movement.

## 2015-05-17 NOTE — Final Progress Note (Signed)
L&D OB Triage Note   HPI:  Monica Wright is a 25 y.o. G27P0020 female at [redacted]w[redacted]d, EDD Estimated Date of Delivery: 05/26/15 who presented to triage for complaints of contractions, q 7-10 min.  She was evaluated by the nurses with no significant findings (ruled out for labor). Vital signs stable. An NST was performed and has been reviewed by MD.   Physical Exam: Blood pressure 124/89, pulse 109, resp. rate 16, last menstrual period 08/19/2014. Cervix: 3/60/-2 (unchanged after 2 hours)  NST INTERPRETATION: Indications: rule out uterine contractions  Mode: External Baseline Rate (A): 150 bpm Variability: Moderate Accelerations: 15 x 15 Decelerations: None     Contraction Frequency (min): irregular, q 7-8 minutes  Impression: reactive   Plan: NST performed was reviewed and was found to be reactive. She was discharged home with bleeding/labor precautions.  Continue routine prenatal care. Follow up with OB/GYN as previously scheduled.     [name], MD

## 2015-05-17 NOTE — ED Notes (Signed)
Patient presents with contractions that are currently 2 minutes apart. Of note, patient was here this am at 0400 and sent home. (+) vaginal bleeding. This is the patient's first pregnancy.  OB/GYn is Valentino Saxon, MD - saw her yesterday - no complications.

## 2015-05-18 ENCOUNTER — Inpatient Hospital Stay: Payer: 59 | Admitting: Anesthesiology

## 2015-05-18 ENCOUNTER — Encounter: Payer: Self-pay | Admitting: *Deleted

## 2015-05-18 DIAGNOSIS — Z23 Encounter for immunization: Secondary | ICD-10-CM | POA: Diagnosis not present

## 2015-05-18 DIAGNOSIS — Z3A38 38 weeks gestation of pregnancy: Secondary | ICD-10-CM | POA: Diagnosis not present

## 2015-05-18 DIAGNOSIS — Z3483 Encounter for supervision of other normal pregnancy, third trimester: Secondary | ICD-10-CM | POA: Diagnosis present

## 2015-05-18 DIAGNOSIS — IMO0001 Reserved for inherently not codable concepts without codable children: Secondary | ICD-10-CM

## 2015-05-18 DIAGNOSIS — Z3493 Encounter for supervision of normal pregnancy, unspecified, third trimester: Secondary | ICD-10-CM | POA: Diagnosis not present

## 2015-05-18 LAB — CBC
HCT: 37.3 % (ref 35.0–47.0)
HCT: 41.3 % (ref 35.0–47.0)
Hemoglobin: 12.8 g/dL (ref 12.0–16.0)
Hemoglobin: 14.2 g/dL (ref 12.0–16.0)
MCH: 32.1 pg (ref 26.0–34.0)
MCH: 32.4 pg (ref 26.0–34.0)
MCHC: 34.4 g/dL (ref 32.0–36.0)
MCHC: 34.4 g/dL (ref 32.0–36.0)
MCV: 93.2 fL (ref 80.0–100.0)
MCV: 94.1 fL (ref 80.0–100.0)
Platelets: 143 10*3/uL — ABNORMAL LOW (ref 150–440)
Platelets: 162 10*3/uL (ref 150–440)
RBC: 3.97 MIL/uL (ref 3.80–5.20)
RBC: 4.43 MIL/uL (ref 3.80–5.20)
RDW: 13.5 % (ref 11.5–14.5)
RDW: 13.5 % (ref 11.5–14.5)
WBC: 15.9 10*3/uL — ABNORMAL HIGH (ref 3.6–11.0)
WBC: 17.7 10*3/uL — ABNORMAL HIGH (ref 3.6–11.0)

## 2015-05-18 LAB — ABO/RH: ABO/RH(D): A POS

## 2015-05-18 LAB — RAPID HIV SCREEN (HIV 1/2 AB+AG)
HIV 1/2 Antibodies: NONREACTIVE
HIV-1 P24 Antigen - HIV24: NONREACTIVE

## 2015-05-18 LAB — TYPE AND SCREEN
ABO/RH(D): A POS
Antibody Screen: NEGATIVE

## 2015-05-18 MED ORDER — DIBUCAINE 1 % RE OINT
1.0000 | TOPICAL_OINTMENT | RECTAL | Status: DC | PRN
Start: 2015-05-18 — End: 2015-05-19

## 2015-05-18 MED ORDER — PHENYLEPHRINE 40 MCG/ML (10ML) SYRINGE FOR IV PUSH (FOR BLOOD PRESSURE SUPPORT)
80.0000 ug | PREFILLED_SYRINGE | INTRAVENOUS | Status: DC | PRN
Start: 2015-05-18 — End: 2015-05-18
  Filled 2015-05-18: qty 2

## 2015-05-18 MED ORDER — PRENATAL MULTIVITAMIN CH
1.0000 | ORAL_TABLET | Freq: Every day | ORAL | Status: DC
  Administered 2015-05-18 – 2015-05-19 (×2): 1 via ORAL
  Filled 2015-05-18 (×2): qty 1

## 2015-05-18 MED ORDER — FENTANYL 2.5 MCG/ML W/ROPIVACAINE 0.2% IN NS 100 ML EPIDURAL INFUSION (ARMC-ANES)
EPIDURAL | Status: DC | PRN
  Administered 2015-05-18: 9 mL/h via EPIDURAL

## 2015-05-18 MED ORDER — SENNOSIDES-DOCUSATE SODIUM 8.6-50 MG PO TABS: 2.0000 | ORAL_TABLET | ORAL | Status: DC

## 2015-05-18 MED ORDER — OXYCODONE-ACETAMINOPHEN 5-325 MG PO TABS: 1.0000 | ORAL_TABLET | ORAL | Status: DC | PRN

## 2015-05-18 MED ORDER — BENZOCAINE-MENTHOL 20-0.5 % EX AERO
1.0000 "application " | INHALATION_SPRAY | CUTANEOUS | Status: DC | PRN
  Filled 2015-05-18: qty 56

## 2015-05-18 MED ORDER — LIDOCAINE HCL (PF) 1 % IJ SOLN
30.0000 mL | INTRAMUSCULAR | Status: AC | PRN
  Administered 2015-05-18: 3 mL via SUBCUTANEOUS

## 2015-05-18 MED ORDER — ACETAMINOPHEN 325 MG PO TABS: 650.0000 mg | ORAL_TABLET | ORAL | Status: DC | PRN

## 2015-05-18 MED ORDER — OXYCODONE-ACETAMINOPHEN 5-325 MG PO TABS: 2.0000 | ORAL_TABLET | ORAL | Status: DC | PRN

## 2015-05-18 MED ORDER — ZOLPIDEM TARTRATE 5 MG PO TABS: 5.0000 mg | ORAL_TABLET | Freq: Every evening | ORAL | Status: DC | PRN

## 2015-05-18 MED ORDER — OXYTOCIN 40 UNITS IN LACTATED RINGERS INFUSION - SIMPLE MED
2.5000 [IU]/h | INTRAVENOUS | Status: DC
  Filled 2015-05-18: qty 1000

## 2015-05-18 MED ORDER — LIDOCAINE-EPINEPHRINE (PF) 1.5 %-1:200000 IJ SOLN
INTRAMUSCULAR | Status: DC | PRN
  Administered 2015-05-18: 4 mL via EPIDURAL

## 2015-05-18 MED ORDER — BUTORPHANOL TARTRATE 1 MG/ML IJ SOLN
1.0000 mg | INTRAMUSCULAR | Status: DC | PRN
  Administered 2015-05-18: 1 mg via INTRAVENOUS
  Filled 2015-05-18: qty 1

## 2015-05-18 MED ORDER — FENTANYL 2.5 MCG/ML W/ROPIVACAINE 0.2% IN NS 100 ML EPIDURAL INFUSION (ARMC-ANES)
EPIDURAL | Status: AC
  Filled 2015-05-18: qty 100

## 2015-05-18 MED ORDER — ONDANSETRON HCL 4 MG/2ML IJ SOLN: 4.0000 mg | Freq: Four times a day (QID) | INTRAMUSCULAR | Status: DC | PRN

## 2015-05-18 MED ORDER — LACTATED RINGERS IV SOLN: 500.0000 mL | INTRAVENOUS | Status: DC | PRN

## 2015-05-18 MED ORDER — DIPHENHYDRAMINE HCL 50 MG/ML IJ SOLN: 12.5000 mg | INTRAMUSCULAR | Status: DC | PRN

## 2015-05-18 MED ORDER — EPHEDRINE 5 MG/ML INJ
10.0000 mg | INTRAVENOUS | Status: DC | PRN
  Filled 2015-05-18: qty 2

## 2015-05-18 MED ORDER — ONDANSETRON HCL 4 MG/2ML IJ SOLN: 4.0000 mg | INTRAMUSCULAR | Status: DC | PRN

## 2015-05-18 MED ORDER — IBUPROFEN 600 MG PO TABS
600.0000 mg | ORAL_TABLET | Freq: Four times a day (QID) | ORAL | Status: DC
  Administered 2015-05-19: 600 mg via ORAL
  Filled 2015-05-18 (×3): qty 1

## 2015-05-18 MED ORDER — DIPHENHYDRAMINE HCL 25 MG PO CAPS: 25.0000 mg | ORAL_CAPSULE | Freq: Four times a day (QID) | ORAL | Status: DC | PRN

## 2015-05-18 MED ORDER — SIMETHICONE 80 MG PO CHEW: 80.0000 mg | CHEWABLE_TABLET | ORAL | Status: DC | PRN

## 2015-05-18 MED ORDER — WITCH HAZEL-GLYCERIN EX PADS: 1.0000 "application " | MEDICATED_PAD | CUTANEOUS | Status: DC | PRN

## 2015-05-18 MED ORDER — BUPIVACAINE HCL (PF) 0.25 % IJ SOLN
INTRAMUSCULAR | Status: DC | PRN
  Administered 2015-05-18: 5 mL via EPIDURAL

## 2015-05-18 MED ORDER — ONDANSETRON HCL 4 MG PO TABS: 4.0000 mg | ORAL_TABLET | ORAL | Status: DC | PRN

## 2015-05-18 MED ORDER — LACTATED RINGERS IV SOLN: 500.0000 mL | Freq: Once | INTRAVENOUS | Status: DC

## 2015-05-18 MED ORDER — FENTANYL 2.5 MCG/ML W/ROPIVACAINE 0.2% IN NS 100 ML EPIDURAL INFUSION (ARMC-ANES): 9.0000 mL/h | EPIDURAL | Status: DC

## 2015-05-18 MED ORDER — OXYTOCIN BOLUS FROM INFUSION
500.0000 mL | INTRAVENOUS | Status: DC
  Administered 2015-05-18: 500 mL via INTRAVENOUS

## 2015-05-18 MED ORDER — LACTATED RINGERS IV SOLN
INTRAVENOUS | Status: DC
  Administered 2015-05-18: 01:00:00 via INTRAVENOUS

## 2015-05-18 MED ORDER — ACETAMINOPHEN-CODEINE #3 300-30 MG PO TABS: 1.0000 | ORAL_TABLET | ORAL | Status: DC | PRN

## 2015-05-18 MED ORDER — MEASLES, MUMPS & RUBELLA VAC ~~LOC~~ INJ: 0.5000 mL | INJECTION | Freq: Once | SUBCUTANEOUS | Status: DC

## 2015-05-18 MED ORDER — LANOLIN HYDROUS EX OINT: TOPICAL_OINTMENT | CUTANEOUS | Status: DC | PRN

## 2015-05-18 MED ORDER — CITRIC ACID-SODIUM CITRATE 334-500 MG/5ML PO SOLN: 30.0000 mL | ORAL | Status: DC | PRN

## 2015-05-18 NOTE — H&P (Signed)
Obstetric History and Physical  SHAKERRA RED is a 25 y.o. G3P0020 with IUP at 2w6dpresenting for contractions. Patient states she has been having  regular, every 2-3 minutes contractions, no vaginal bleeding, intact membranes, with active fetal movement.    Prenatal Course Source of Care: [facility] with onset of care at 11 weeks Pregnancy complications or risks: Patient Active Problem List   Diagnosis Date Noted  . Active labor 05/18/2015  . Irregular uterine contractions 05/17/2015  . Labor and delivery, indication for care 04/26/2015  . 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 Maternal varicella, non-immune 12/28/2014  . Flu vaccine need 12/28/2014  . Supervision of normal pregnancy in third trimester 12/28/2014  . Pregnancy complicated by previous recurrent miscarriages 11/01/2014  . H/O maternal chlamydia infection, currently pregnant 10/01/2014   She plans to breastfeed She desires Depo-Provera for postpartum contraception.   Prenatal labs and studies: ABO, Rh: --/--/A POS (02/24 0031) Antibody: NEG (02/24 0031) Rubella: 2.98 (07/05 1230) RPR: Non Reactive (07/05 1230)  HBsAg: Negative (07/05 1230)  HIV: Non Reactive (07/05 1230)  GBS: Negative (02/01  1149)  1 hr Glucola elevated (180) with normal 3 hr GTT Genetic screening normal Anatomy UK[place]ormal  Prenatal Transfer Tool  Maternal Diabetes: No Genetic Screening: Normal Maternal Ultrasounds/Referrals: Normal Fetal Ultrasounds or other Referrals:  None Maternal Substance Abuse:  No Significant Maternal Medications:  None Significant Maternal Lab Results: Lab values include: Group B Strep negative  Past Medical History  Diagnosis Date  . Scoliosis     Past Surgical History  Procedure Laterality Date  . No past surgeries      OB History  Gravida Para Term Preterm AB SAB TAB Ectopic Multiple Living  _0 # Outcome Date GA Lbr Len/2nd Weight Sex Delivery Anes PTL Lv  3 Current            2 SAB 2015          1 SAB 2009              Social History   Social History  . Marital Status: Single    Spouse Name: N/A  . Number of Children: N/A  . Years of Education: N/A   Social History Main Topics  . Smoking status: Former Smoker -- 0.25 packs/day    Types: Cigarettes    Quit date: 08/14/2014  . Smokeless tobacco: None  . Alcohol Use: No  . Drug Use: No  . Sexual Activity: Yes    Birth Control/ Protection: None   Other Topics Concern  . None   Social History Narrative    Family History  Problem Relation Age of Onset  . Heart disease Neg Hx   . Drug abuse Neg Hx   . Cancer Neg Hx     Prescriptions prior to admission  Medication Sig Dispense Refill Last Dose  . Prenatal Vit-Fe Fumarate-FA (MULTIVITAMIN-PRENATAL) 27-0.8 MG TABS tablet Take 1 tablet by mouth daily at 12 noon.   Taking    No Known Allergies  Review of Systems: Negative except for what is mentioned in HPI.  Physical Exam: BP 99/55 mmHg  Pulse 68  Temp(Src) 97.7 F (36.5 C) (Oral)  Resp 16  Ht _1  (1.676 m)  Wt 175 lb (79.379 kg)  BMI 28.26 kg/m2  SpO2 98%  LMP 08/19/2014 (Approximate) CONSTITUTIONAL: Well-developed, well-nourished female in no acute distress.  HENT:  Normocephalic, atraumatic, External  right and left ear normal. Oropharynx is clear and moist EYES: Conjunctivae and EOM are normal. Pupils are equal, round, and reactive to light. No scleral icterus.  NECK: Normal range of motion, supple, no masses SKIN: Skin is warm and dry. No rash noted. Not diaphoretic. No erythema. No pallor. NEUROLOGIC: Alert and oriented to person, place, and time. Normal reflexes, muscle tone coordination. No cranial nerve deficit noted. PSYCHIATRIC: Normal mood and affect. Normal behavior. Normal judgment and thought content. CARDIOVASCULAR: Normal heart rate noted, regular rhythm RESPIRATORY: Effort and breath sounds normal, no problems with respiration noted ABDOMEN: Soft, nontender,  nondistended, gravid. MUSCULOSKELETAL: Normal range of motion. No edema and no tenderness. 2+ distal pulses.  Cervical Exam: Dilatation 6cm   Effacement 80%   Station -1   Presentation: cephalic FHT:  Baseline rate 140 bpm   Variability moderate  Accelerations present   Decelerations none Contractions: Every 2-4 mins   Pertinent Labs/Studies:   Results for orders placed or performed during the hospital encounter of 05/17/15 (from the past 24 hour(s))  Type and screen [place]     Status: None   Collection Time: 05/18/15 12:31 AM  Result Value Ref Range   ABO/RH(D) A POS    Antibody Screen NEG    Sample Expiration 05/21/2015   CBC     Status: Abnormal   Collection Time: 05/18/15 12:32 AM  Result Value Ref Range   WBC 15.9 (H) 3.6 - 11.0 K/uL   RBC 4.43 3.80 - 5.20 MIL/uL   Hemoglobin 14.2 12.0 - 16.0 g/dL   HCT 41.3 35.0 - 47.0 %   MCV 93.2 80.0 - 100.0 fL   MCH 32.1 26.0 - 34.0 pg   MCHC 34.4 32.0 - 36.0 g/dL   RDW 13.5 11.5 - 14.5 %   Platelets 162 150 - 440 K/uL    Assessment : ABIMBOLA AKI is a 25 y.o. G3P0020 at 60w6dbeing admitted for labor.  Plan: Labor: Expectant management.  Augmentation as needed, per protocol FWB: Reassuring fetal heart tracing.  GBS negative Delivery plan: Hopeful for vaginal delivery MMR vaccine postpartum.   ARubie Maid MD [facility]

## 2015-05-18 NOTE — Anesthesia Procedure Notes (Signed)
Epidural Patient location during procedure: OB  Staffing Performed by: anesthesiologist   Preanesthetic Checklist Completed: patient identified, site marked, surgical consent, pre-op evaluation, timeout performed, IV checked, risks and benefits discussed and monitors and equipment checked  Epidural Patient position: sitting Prep: Betadine Patient monitoring: heart rate, continuous pulse ox and blood pressure Approach: midline Location: L4-L5 Injection technique: LOR saline  Needle:  Needle type: Tuohy  Needle gauge: 18 G Needle length: 9 cm and 9 Needle insertion depth: 5 cm Catheter type: closed end flexible Catheter size: 20 Guage Catheter at skin depth: 11 cm Test dose: negative and 1.5% lidocaine with Epi 1:200 K  Assessment Sensory level: T10 Events: blood not aspirated, injection not painful, no injection resistance, negative IV test and no paresthesia  Additional Notes   Patient tolerated the insertion well without complications.-Easy placement SATD -IVTD. No paresthesia. Refer to [facility] nursing for VS and dosingReason for block:procedure for pain

## 2015-05-18 NOTE — Anesthesia Preprocedure Evaluation (Signed)
Anesthesia Evaluation  Patient identified by MRN, date of birth, ID band Patient awake    Reviewed: Allergy & Precautions, H&P , NPO status , Patient's Chart, lab work & pertinent test results, reviewed documented beta blocker date and time   Airway Mallampati: III  TM Distance: >3 FB Neck ROM: full    Dental no notable dental hx. (+) Teeth Intact   Pulmonary neg pulmonary ROS, former smoker,    Pulmonary exam normal breath sounds clear to auscultation       Cardiovascular Exercise Tolerance: Good negative cardio ROS Normal cardiovascular exam Rhythm:regular Rate:Normal     Neuro/Psych Hx of scoliosis and previous L1 fx without hx of neurologic problems and lbp with pregnancy.  JA negative neurological ROS  negative psych ROS   GI/Hepatic negative GI ROS, Neg liver ROS,   Endo/Other  negative endocrine ROS  Renal/GU negative Renal ROS  negative genitourinary   Musculoskeletal   Abdominal   Peds  Hematology negative hematology ROS (+)   Anesthesia Other Findings   Reproductive/Obstetrics (+) Pregnancy                             Anesthesia Physical Anesthesia Plan  ASA: II  Anesthesia Plan: Regional and Epidural   Post-op Pain Management:    Induction:   Airway Management Planned:   Additional Equipment:   Intra-op Plan:   Post-operative Plan:   Informed Consent: I have reviewed the patients History and Physical, chart, labs and discussed the procedure including the risks, benefits and alternatives for the proposed anesthesia with the patient or authorized representative who has indicated his/her understanding and acceptance.     Plan Discussed with: CRNA  Anesthesia Plan Comments:         Anesthesia Quick Evaluation

## 2015-05-19 LAB — CBC
HCT: 36.2 % (ref 35.0–47.0)
Hemoglobin: 12.8 g/dL (ref 12.0–16.0)
MCH: 33.4 pg (ref 26.0–34.0)
MCHC: 35.4 g/dL (ref 32.0–36.0)
MCV: 94.2 fL (ref 80.0–100.0)
Platelets: 176 10*3/uL (ref 150–440)
RBC: 3.84 MIL/uL (ref 3.80–5.20)
RDW: 13.7 % (ref 11.5–14.5)
WBC: 13 10*3/uL — ABNORMAL HIGH (ref 3.6–11.0)

## 2015-05-19 LAB — RPR: RPR Ser Ql: NONREACTIVE

## 2015-05-19 MED ORDER — IBUPROFEN 800 MG PO TABS: 800.0000 mg | ORAL_TABLET | Freq: Three times a day (TID) | ORAL | Status: DC | PRN

## 2015-05-19 MED ORDER — VARICELLA VIRUS VACCINE LIVE 1350 PFU/0.5ML IJ SUSR
0.5000 mL | Freq: Once | INTRAMUSCULAR | Status: AC
  Administered 2015-05-19: 0.5 mL via SUBCUTANEOUS
  Filled 2015-05-19: qty 0.5

## 2015-05-19 NOTE — Progress Notes (Signed)
Patient understands all discharge instructions and the need to make follow up appointments. Patient discharge via wheelchair with auxillary. 

## 2015-05-19 NOTE — Progress Notes (Addendum)
Post Partum Day # 1, s/p SVD  Subjective: no complaints, up ad lib, voiding and tolerating PO  Objective: Temp:  [97.9 F (36.6 C)-98.5 F (36.9 C)] 98.2 F (36.8 C) (02/25 0736) Pulse Rate:  [76-109] 86 (02/25 0736) Resp:  [17-20] 18 (02/25 0736) BP: (105-128)/(67-88) 105/67 mmHg (02/25 0736) SpO2:  [94 %-98 %] 98 % (02/25 0736)  Physical Exam:  General: alert and no distress  Lungs: clear to auscultation bilaterally Breasts: normal appearance, no masses or tenderness Heart: regular rate and rhythm, S1, S2 normal, no murmur, click, rub or gallop Pelvis: Lochia: appropriate, Uterine Fundus: firm Extremities:  Negative Homan's sign.  No cords or calf tenderness. No significant calf/ankle edema.   Recent Labs  05/18/15 0032 05/18/15 0636  HGB 14.2 12.8  HCT 41.3 37.3    Assessment/Plan: Discharge home, Breastfeeding and Contraception Depo Provera  Will order posptartum CBC   LOS: 1 day   [name] [facility]

## 2015-05-19 NOTE — Discharge Instructions (Signed)

## 2015-05-19 NOTE — Anesthesia Postprocedure Evaluation (Signed)
Anesthesia Post Note  Patient: Monica Wright  Procedure(s) Performed: * No procedures listed *  Patient location during evaluation: Mother Baby Anesthesia Type: Epidural Level of consciousness: awake and alert Pain management: pain level controlled Vital Signs Assessment: post-procedure vital signs reviewed and stable Respiratory status: spontaneous breathing, nonlabored ventilation and respiratory function stable Cardiovascular status: stable Postop Assessment: no headache, no backache and epidural receding Anesthetic complications: no    Last Vitals:  Filed Vitals:   05/19/15 0043 05/19/15 0736  BP: 109/70 105/67  Pulse: 76 86  Temp: 36.6 C 36.8 C  Resp: 17 18    Last Pain:  Filed Vitals:   05/19/15 1013  PainSc: 0-No pain                 KEPHART,WILLIAM K

## 2015-05-20 NOTE — Discharge Summary (Signed)
Obstetric Discharge Summary Reason for Admission: onset of labor Prenatal Procedures: ultrasound Intrapartum Procedures: spontaneous vaginal delivery Postpartum Procedures: Varivax vaccine Complications-Operative and Postpartum: 1st degree bilateral periurethral laceration    CBC Latest Ref Rng 05/19/2015 05/18/2015 05/18/2015  WBC 3.6 - 11.0 K/uL 13.0(H) 17.7(H) 15.9(H)  Hemoglobin 12.0 - 16.0 g/dL 82.9 56.2 13.0  Hematocrit 35.0 - 47.0 % 36.2 37.3 41.3  Platelets 150 - 440 K/uL 176 143(L) 162     Physical Exam:  General: alert and no distress Lochia: appropriate Uterine Fundus: firm Incision: none DVT Evaluation: Negative Homan's sign. No cords or calf tenderness. No significant calf/ankle edema.  Discharge Diagnoses: Term Pregnancy-delivered  Discharge Information: Date: 05/20/2015 Activity: pelvic rest Diet: routine Medications: PNV and Ibuprofen Condition: stable Instructions: refer to practice specific booklet Discharge to: home Follow-up Information    Follow up with [name], MD In 6 weeks.   Specialties:  Obstetrics and Gynecology, Radiology   Why:  Postpartum visit   Contact information:   [address] [place] [place] [address] [phone]       Newborn Data: Live born female  Birth Weight: 8 lb 0.4 oz (3640 g) APGAR: 8, 9  Home with mother.  [name] 05/20/2015, 6:08 PM

## 2015-05-22 ENCOUNTER — Encounter: Payer: 59 | Admitting: Obstetrics and Gynecology

## 2015-06-20 ENCOUNTER — Ambulatory Visit (INDEPENDENT_AMBULATORY_CARE_PROVIDER_SITE_OTHER): Payer: 59 | Admitting: Obstetrics and Gynecology

## 2015-06-20 ENCOUNTER — Encounter: Payer: Self-pay | Admitting: Obstetrics and Gynecology

## 2015-06-20 DIAGNOSIS — Z9189 Other specified personal risk factors, not elsewhere classified: Secondary | ICD-10-CM

## 2015-06-20 DIAGNOSIS — M545 Low back pain, unspecified: Secondary | ICD-10-CM

## 2015-06-20 DIAGNOSIS — Z30013 Encounter for initial prescription of injectable contraceptive: Secondary | ICD-10-CM

## 2015-06-20 MED ORDER — MEDROXYPROGESTERONE ACETATE 150 MG/ML IM SUSP: 150.0000 mg | INTRAMUSCULAR | Status: DC

## 2015-06-20 NOTE — Progress Notes (Signed)
   OBSTETRICS POSTPARTUM CLINIC PROGRESS NOTE  Subjective:     Monica Wright is a 25 y.o. 903P1021 female who presents for a postpartum visit. She is 4 weeks postpartum following a spontaneous vaginal delivery. I have fully reviewed the prenatal and intrapartum course. The delivery was at 38.6 gestational weeks.  Anesthesia: epidural. Postpartum course has been well. Baby's course has been well. Baby is feeding by bottle. Bleeding: patient has not resumed menses, with No LMP recorded.. Bowel function is normal. Bladder function is normal. Patient is not sexually active. Contraception method desired is Depo-Provera injections. Postpartum depression screening: negative PHQ-9 score is 3.  The following portions of the patient's history were reviewed and updated as appropriate: allergies, current medications, past family history, past medical history, past social history, past surgical history and problem list.  Review of Systems A comprehensive review of systems was negative except for: Genitourinary: positive for vaginal discomfort; denies abnormal bleeding or discharge Musculoskeletal: positive for back pain where epidural was placed, mild.  Objective:    BP 94/66 mmHg  Pulse 81  Ht 5\' 7"  (1.702 m)  Wt 150 lb 14.4 oz (68.448 kg)  BMI 23.63 kg/m2  Breastfeeding? Yes  General:  alert and no distress   Breasts:  inspection negative, no nipple discharge or bleeding, no masses or nodularity palpable  Lungs: clear to auscultation bilaterally  Heart:  regular rate and rhythm, S1, S2 normal, no murmur, click, rub or gallop  Abdomen: soft, non-tender; bowel sounds normal; no masses,  no organomegaly.     Vulva:  normal  Vagina: normal vagina, no discharge, exudate, lesion, or erythema  Cervix:  no cervical motion tenderness and no lesions  Corpus: normal size, contour, position, consistency, mobility, non-tender  Adnexa:  normal adnexa and no mass, fullness, tenderness  Rectal Exam: Not  performed.         Labs:  Lab Results  Component Value Date   HGB 12.8 05/19/2015     Assessment:   Routine postpartum exam. Mild vaginal discomfort Mild back pain    Plan:    1. Contraception: Depo-Provera injections desired.  To f/u in 1 week for nurse visit for administration.  2. Discussed use of OTC pain meds (Tylenol, Ibuprofen) for mild aches/pains.  Also can use ice packs or heating pad to back.  3. Follow up in: 1 week for Depo injection as needed.   Will need to f/u in 3-6 month for annual exam, or with primary care doctor if desired.    Hildred LaserAnika Elian Gloster, MD [facility]

## 2015-06-20 NOTE — Patient Instructions (Signed)

## 2015-06-26 ENCOUNTER — Ambulatory Visit: Payer: 59 | Admitting: Obstetrics and Gynecology

## 2015-06-26 ENCOUNTER — Ambulatory Visit (INDEPENDENT_AMBULATORY_CARE_PROVIDER_SITE_OTHER): Payer: 59 | Admitting: Obstetrics and Gynecology

## 2015-06-26 VITALS — BP 98/65 | HR 86 | Wt 149.4 lb

## 2015-06-26 DIAGNOSIS — Z3042 Encounter for surveillance of injectable contraceptive: Secondary | ICD-10-CM | POA: Diagnosis not present

## 2015-06-26 LAB — POCT URINE PREGNANCY: Preg Test, Ur: NEGATIVE

## 2015-06-26 MED ORDER — MEDROXYPROGESTERONE ACETATE 150 MG/ML IM SUSP
150.0000 mg | Freq: Once | INTRAMUSCULAR | Status: AC
Start: 2015-06-26 — End: 2015-06-26
  Administered 2015-06-26: 150 mg via INTRAMUSCULAR

## 2015-06-26 NOTE — Progress Notes (Signed)
Patient ID: Stephens NovemberBeverly L Zaun, female   DOB: [birth year], 25 y.o.   MRN: [id number] Pt presents for depo-provera postpartum. UPT: negative. Pt states she has been on this medication before.

## 2015-06-29 ENCOUNTER — Telehealth: Payer: Self-pay | Admitting: Obstetrics and Gynecology

## 2015-06-29 NOTE — Telephone Encounter (Signed)
Monica Wright called and wants a note to go back to work Monday 07/02/15. i can give her one if it's ok with dr Valentino Saxoncherry

## 2015-06-29 NOTE — Telephone Encounter (Signed)
Yes this pt has already had her 6wk postpartum visit and is ok to resume work. If you could give her a letter that would be great. Thanks

## 2015-07-03 ENCOUNTER — Ambulatory Visit: Payer: 59 | Admitting: Obstetrics and Gynecology

## 2015-07-10 ENCOUNTER — Ambulatory Visit (INDEPENDENT_AMBULATORY_CARE_PROVIDER_SITE_OTHER): Payer: 59 | Admitting: Obstetrics and Gynecology

## 2015-07-10 ENCOUNTER — Encounter: Payer: Self-pay | Admitting: Obstetrics and Gynecology

## 2015-07-10 VITALS — BP 107/69 | HR 76 | Wt 148.0 lb

## 2015-07-10 DIAGNOSIS — N63 Unspecified lump in unspecified breast: Secondary | ICD-10-CM

## 2015-07-10 NOTE — Progress Notes (Signed)
   GYNECOLOGY CLINIC PROGRESS NOTE  Subjective:     Monica Wright is an [age] y.o. 703P1021 female who presents for evaluation of a breast mass. Change was noted 1 week ago, and has been gradually improving since first identified. Patient does routinely do self breast exams. The mass does change during menstrual cycle (gets slightly smaller). The mass is not tender. Patient admits to nipple discharge (is breast pumping). Breast cancer risk factors include: none.  Does note h/o mastitis 5 weeks ago (is approximately 2 months postpartum) in same breast.   Currently denies associated fevers, chills, or rashes.  The following portions of the patient's history were reviewed and updated as appropriate: allergies, current medications, past family history, past medical history, past social history, past surgical history and problem list.  Review of Systems Pertinent items are noted in HPI.     Objective:    BP 107/69 mmHg  Pulse 76  Wt 148 lb (67.132 kg)  LMP 07/10/2015  Breastfeeding? Yes General appearance: alert and no distress Neck: no adenopathy, no carotid bruit, no JVD, supple, symmetrical, trachea midline and thyroid not enlarged, symmetric, no tenderness/mass/nodules Breasts: No nipple retraction or dimpling, No nipple discharge or bleeding, No axillary or supraclavicular adenopathy, positive findings: 1.5 cm, smooth, round, mobile and non-tender nodule located on the right lower inner quadrant approximately 2 cm from nipple (at 4 o'clock) Skin: Skin color, texture, turgor normal. No rashes or lesions    Assessment:    breast mass, likely benign    Plan:    Reassured the patient that the finding is most likely benign. Follow up in 2 months should symptoms not improve.   Can then proceed with further workup at that time if indicated.  Instructed patient that she can continue to breastfeed/pump.    Hildred LaserAnika Jacky Hartung, MD [facility]

## 2015-08-12 ENCOUNTER — Emergency Department
Admission: EM | Admit: 2015-08-12 | Discharge: 2015-08-12 | Disposition: A | Discharge status: Home / Self Care | Payer: 59 | Attending: Emergency Medicine | Admitting: Emergency Medicine

## 2015-08-12 ENCOUNTER — Encounter: Payer: Self-pay | Admitting: Emergency Medicine

## 2015-08-12 DIAGNOSIS — J029 Acute pharyngitis, unspecified: Secondary | ICD-10-CM | POA: Diagnosis present

## 2015-08-12 DIAGNOSIS — Z87891 Personal history of nicotine dependence: Secondary | ICD-10-CM | POA: Insufficient documentation

## 2015-08-12 DIAGNOSIS — K122 Cellulitis and abscess of mouth: Secondary | ICD-10-CM | POA: Insufficient documentation

## 2015-08-12 DIAGNOSIS — M273 Alveolitis of jaws: Secondary | ICD-10-CM

## 2015-08-12 LAB — POCT RAPID STREP A: Streptococcus, Group A Screen (Direct): NEGATIVE

## 2015-08-12 MED ORDER — PREDNISOLONE 15 MG/5ML PO SOLN: 30.0000 mg | Freq: Every day | ORAL | Status: AC

## 2015-08-12 MED ORDER — AMOXICILLIN 400 MG/5ML PO SUSR: 500.0000 mg | Freq: Three times a day (TID) | ORAL | Status: AC

## 2015-08-12 MED ORDER — HYDROCODONE-ACETAMINOPHEN 5-325 MG PO TABS: 1.0000 | ORAL_TABLET | Freq: Four times a day (QID) | ORAL | Status: DC | PRN

## 2015-08-12 MED ORDER — HYDROCODONE-ACETAMINOPHEN 5-325 MG PO TABS
1.0000 | ORAL_TABLET | Freq: Once | ORAL | Status: AC
  Administered 2015-08-12: 1 via ORAL
  Filled 2015-08-12: qty 1

## 2015-08-12 NOTE — Discharge Instructions (Signed)
Dental Dry Socket °After a tooth is pulled (extracted), blood fills up the hole (socket) where the tooth once was. This blood hardens (clots) and protects the bone and nerves underneath. Normally, gums completely grow over the top of the bones and nerves and close an open socket in about a week. After several months, the clot is replaced by bone that grows into the socket. °However, when blood does not fill up the extraction socket, or the blood clot is lost for some reason, a dry socket may form. This condition leaves the bone and nerves exposed to air, food, liquid, or anything else that enters the mouth. The gums cannot grow over the extraction socket because there is nothing to grow over, and the dry socket remains open.  °CAUSES  °· Blood not filling up the extraction socket properly. °· Anything that can dislodge a forming blood clot. Forceful spitting or sucking through a straw can pull a blood clot completely out of the socket and cause a dry socket. °· Having a difficult extraction. The forceful pushing against the wall of the socket when the tooth is extracted causes the walls of the tooth socket to become crushed. This prevents bleeding into the socket because the blood vessels have been crushed closed. °· Alcoholic drinks may dry out the blood clot and cause a dry socket. °· Smoking can disturb blood clot formation and cause a dry socket. °Factors that put you at an increased risk for a dry socket include:  °· Having lower teeth extracted. °· Being female. °· Poor oral hygiene. °· Taking birth control pills. °· Having your wisdom teeth extracted. °SYMPTOMS °· Severe, constant, dull throbbing pain. The pain generally begins 2 to 3 days after the tooth extraction. The pain may last about a week after it begins. °· Bad smelling breath and bad taste in your mouth. °· Ear pain. °HOME CARE INSTRUCTIONS °· Follow your dentist's instructions. °· Only take over-the-counter or prescription medicines for pain,  discomfort, or fever as directed by your caregiver. If pain medication does not relieve the pain, you may need to see your dentist who can clean the socket and place a medicated dressing on the extraction to promote healing. °· Take your antibiotics as told, if prescribed. Finish them even if you start to feel better. °· Wait at least a day before rinsing with warm salt water to avoid possibly dissolving the new blood clot. When salt water rinsing, spit gently to avoid pressure on the clot. °· Avoid carbonated beverages. °· Avoid alcohol. °· Avoid smoking for a few days after surgery. °Patients who have recently had oral surgery should avoid anything that may irritate the extraction socket or anything that may cause the blood clot inside the extraction socket from being dislodged. Carefully follow your instructions for after surgery care.  °SEEK IMMEDIATE DENTAL CARE IF: °· Your medicine does not relieve pain. °· You have uncontrolled bleeding, marked swelling, or severe pain. °· You develop a fever above 102° F (38.9° C), not controlled by medication. °· You have difficulty swallowing or cannot open your mouth. °· You have other severe symptoms. °  °This information is not intended to replace advice given to you by your health care provider. Make sure you discuss any questions you have with your health care provider. °  °Document Released: 09/14/2002 Document Revised: 06/02/2011 Document Reviewed: 10/23/2014 °Elsevier Interactive Patient Education ©2016 [employer] ° °

## 2015-08-12 NOTE — ED Provider Notes (Signed)
[facility] Emergency Department Provider Note  ____________________________________________  Time seen: Approximately 11:45 AM  I have reviewed the triage vital signs and the nursing notes.   HISTORY  Chief Complaint Sore Throat    HPI Monica Wright is a 25 y.o. female , NAD, presents to the emergency department accompanied by her husband who assists with history. States she had both bottom wisdom teeth removed at her dentist on Thursday. Has had increasing pain and decreased range of motion of her jaw since that time. She return to her dentist office on Thursday due to the pain but was told that that was to be expected. Patient was not discharged with any medications from the dentist office. States today she had sore throat and a sensation that she was going to choke. Has not had any redness, swelling about the outside of her lips or jaw. Does note left throat pain is worse than right. Has not had any fevers, chills, body aches, abdominal pain, nausea, vomiting, diarrhea.   Past Medical History  Diagnosis Date  . Scoliosis   . 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 H/O chlamydia infection 10/01/2014    S/p treatment.      Patient Active Problem List   Diagnosis Date Noted  . 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 H/O chlamydia infection 10/01/2014    Past Surgical History  Procedure Laterality Date  . No past surgeries      Current Outpatient Rx  Name  Route  Sig  Dispense  Refill  . amoxicillin (AMOXIL) 400 MG/5ML suspension   Oral   Take 6.3 mLs (500 mg total) by mouth 3 (three) times daily.   200 mL   0   . HYDROcodone-acetaminophen (NORCO) 5-325 MG tablet   Oral   Take 1 tablet by mouth every 6 (six) hours as needed for severe pain.   6 tablet   0   . medroxyPROGESTERone (DEPO-PROVERA) 150 MG/ML injection   Intramuscular   Inject 1 mL (150 mg total) into the muscle every 3 (three) months.   1 mL   4   . prednisoLONE (PRELONE)  15 MG/5ML SOLN   Oral   Take 10 mLs (30 mg total) by mouth daily before breakfast.   180 mL   0   . Prenatal Vit-Fe Fumarate-FA (MULTIVITAMIN-PRENATAL) 27-0.8 MG TABS tablet   Oral   Take 1 tablet by mouth daily at 12 noon.           Allergies Review of patient's allergies indicates no known allergies.  Family History  Problem Relation Age of Onset  . Heart disease Neg Hx   . Drug abuse Neg Hx   . Cancer Neg Hx     Social History Social History  Substance Use Topics  . Smoking status: Former Smoker -- 0.25 packs/day    Types: Cigarettes    Quit date: 08/14/2014  . Smokeless tobacco: None  . Alcohol Use: No     Review of Systems  Constitutional: No fever/chills, fatigue Eyes: No visual changes. ENT: Positive for mouth/dental pain, sore throat. No nasal congestion, runny nose, sneezing, sinus pressure, ear pain. Cardiovascular: No chest pain, palpitations. Respiratory: No cough, chest congestion. No shortness of breath. No wheezing.  Gastrointestinal: No abdominal pain.  No nausea, vomiting.  No diarrhea.  Musculoskeletal: Positive jaw pain. Negative for neck pain.  Skin: Negative for rash, redness, swelling, skin sores, bruising. Neurological: Negative for headaches, focal weakness or numbness. No tingling. 10-point ROS  otherwise negative.  ____________________________________________   PHYSICAL EXAM:  VITAL SIGNS: ED Triage Vitals  Enc Vitals Group     BP 08/12/15 1108 123/78 mmHg     Pulse Rate 08/12/15 1108 88     Resp 08/12/15 1108 18     Temp 08/12/15 1108 98 F (36.7 C)     Temp Source 08/12/15 1108 Oral     SpO2 08/12/15 1113 98 %     Weight 08/12/15 1108 140 lb (63.504 kg)     Height 08/12/15 1108 5\' 7"  (1.702 m)     Head Cir --      Peak Flow --      Pain Score 08/12/15 1114 10     Pain Loc --      Pain Edu? --      Excl. in GC? --      Constitutional: Alert and oriented. Well appearing and in no acute distress. Eyes: Conjunctivae are  normal. PERRL. EOMI without pain.  Head: Atraumatic. ENT:      Nose: No congestion/rhinnorhea.      Mouth/Throat: Mucous membranes are moist.  Unable to see pharynx and most of the mucosa due to patient's inability to open her mouth more than 8 mm due to pain. Neck: No stridor. Supple with full range of motion. Hematological/Lymphatic/Immunilogical: Positive for left, anterior, focal cervical lymphadenopathy with moderate pain to palpation but is mobile. Cardiovascular: Good peripheral circulation with 2+ pulses noted in bilateral upper extremities. Respiratory: Normal respiratory effort without tachypnea or retractions.  Musculoskeletal: Decreased range of motion of jaw due to dental pain but no crepitus or tenderness about the TMJ. Neurologic:  Normal speech and language. No gross focal neurologic deficits are appreciated. Gait and posture are normal. Skin:  Skin is warm, dry and intact. No rash, redness, swelling, skin sores noted. Psychiatric: Mood and affect are normal. Speech and behavior are normal. Patient exhibits appropriate insight and judgement.   ____________________________________________   LABS (all labs ordered are listed, but only abnormal results are displayed)  Labs Reviewed  POCT RAPID STREP A   ____________________________________________  EKG  None ____________________________________________  RADIOLOGY  None ____________________________________________    PROCEDURES  Procedure(s) performed: None    Medications  HYDROcodone-acetaminophen (NORCO/VICODIN) 5-325 MG per tablet 1 tablet (1 tablet Oral Given 08/12/15 1223)   Patient tolerated oral Norco well and was able swallow without any difficulty.   ____________________________________________   INITIAL IMPRESSION / ASSESSMENT AND PLAN / ED COURSE  Patient's diagnosis is consistent with abscess and cellulitis oral soft tissues with potential dry socket. Patient will be discharged home with  prescriptions for amoxicillin, Norco, prednisolone to take as directed. Did discuss that patient will need to discontinue breast feeding while on prescribed medications. Patient is to follow up with her dentist tomorrow for recheck. Patient is given ED precautions to return to the ED for any worsening or new symptoms.      ____________________________________________  FINAL CLINICAL IMPRESSION(S) / ED DIAGNOSES  Final diagnoses:  Abscess or cellulitis, oral soft tissue  Dry tooth socket      NEW MEDICATIONS STARTED DURING THIS VISIT:  New Prescriptions   AMOXICILLIN (AMOXIL) 400 MG/5ML SUSPENSION    Take 6.3 mLs (500 mg total) by mouth 3 (three) times daily.   HYDROCODONE-ACETAMINOPHEN (NORCO) 5-325 MG TABLET    Take 1 tablet by mouth every 6 (six) hours as needed for severe pain.   PREDNISOLONE (PRELONE) 15 MG/5ML SOLN    Take 10 mLs (30 mg total)  by mouth daily before breakfast.         Hope Pigeon, PA-C 08/12/15 1241  Sharyn Creamer, MD 08/12/15 [phone]

## 2015-08-12 NOTE — ED Notes (Signed)
Wisdom teeth removed on Thursday.  C/O sore throat and feeling like she is going to choke..Marland Kitchen

## 2015-09-11 ENCOUNTER — Encounter: Payer: Self-pay | Admitting: Obstetrics and Gynecology

## 2015-09-11 ENCOUNTER — Ambulatory Visit (INDEPENDENT_AMBULATORY_CARE_PROVIDER_SITE_OTHER): Payer: 59 | Admitting: Obstetrics and Gynecology

## 2015-09-11 VITALS — BP 111/73 | HR 84 | Wt 144.7 lb

## 2015-09-11 DIAGNOSIS — N63 Unspecified lump in unspecified breast: Secondary | ICD-10-CM

## 2015-09-11 NOTE — Progress Notes (Signed)
   GYNECOLOGY CLINIC PROGRESS NOTE  Subjective:     Monica Wright is an 25 y.o. [id number] female who presents for re-evaluation of a right breast mass.Onset was approximately 2 months ago, and has remained stabile since that time.  Patient does routinely do self breast exams. The mass does change during menstrual cycle (gets slightly smaller). The mass is not tender. Patient admits to clear nipple discharge Breast cancer risk factors include: none.  H/o postpartum mastitis 3-4 months ago in same breast.   Currently denies associated fevers, chills, or rashes.  Of note, patient also presents for regularly scheduled Depo Provera injection for contraception.  Denies complaints or side effects regarding medication. No LMP recorded. Patient has had an injection.  The following portions of the patient's history were reviewed and updated as appropriate: allergies, current medications, past family history, past medical history, past social history, past surgical history and problem list.  Review of Systems Pertinent items are noted in HPI.     Objective:    BP 111/73 mmHg  Pulse 84  Wt 144 lb 11.2 oz (65.635 kg)  Breastfeeding? No General appearance: alert and no distress Neck: no adenopathy, no carotid bruit, no JVD, supple, symmetrical, trachea midline and thyroid not enlarged, symmetric, no tenderness/mass/nodules Breasts: No nipple retraction or dimpling, No nipple discharge or bleeding, No axillary or supraclavicular adenopathy, positive findings: 1.5 cm, smooth, round, mobile and non-tender nodule located on the right lower inner quadrant approximately 2 cm from nipple (at 4 o'clock) Skin: Skin color, texture, turgor normal. No rashes or lesions    Assessment:    breast mass, likely benign, persistent   Contraception management   Plan:   Reassured patient that breast mass was most likely benign.  Due to persistence of mass, will proceed with further workup with ultrasound and if  indicated.  Patient was due for next Depo Provera injection but forgot to pick up medication from the pharmacy.  Will have patient return in next few days for nurse visit to receive injection.   Hildred LaserAnika Caleel Kiner, MD [facility]

## 2015-09-17 ENCOUNTER — Ambulatory Visit: Payer: 59

## 2016-04-17 DIAGNOSIS — M79642 Pain in left hand: Secondary | ICD-10-CM | POA: Diagnosis not present

## 2016-04-17 DIAGNOSIS — F17210 Nicotine dependence, cigarettes, uncomplicated: Secondary | ICD-10-CM | POA: Diagnosis not present

## 2016-04-17 DIAGNOSIS — S61412A Laceration without foreign body of left hand, initial encounter: Secondary | ICD-10-CM | POA: Diagnosis not present

## 2016-04-17 DIAGNOSIS — Z8673 Personal history of transient ischemic attack (TIA), and cerebral infarction without residual deficits: Secondary | ICD-10-CM | POA: Diagnosis not present

## 2016-06-28 ENCOUNTER — Other Ambulatory Visit: Payer: Self-pay | Admitting: Obstetrics and Gynecology

## 2016-09-11 ENCOUNTER — Encounter: Payer: 59 | Admitting: Obstetrics and Gynecology

## 2016-09-15 ENCOUNTER — Ambulatory Visit: Payer: 59

## 2016-09-18 ENCOUNTER — Encounter: Payer: 59 | Admitting: Obstetrics and Gynecology

## 2016-09-22 ENCOUNTER — Ambulatory Visit (INDEPENDENT_AMBULATORY_CARE_PROVIDER_SITE_OTHER): Payer: 59 | Admitting: Certified Nurse Midwife

## 2016-09-22 ENCOUNTER — Encounter: Payer: Self-pay | Admitting: Certified Nurse Midwife

## 2016-09-22 VITALS — BP 91/61 | HR 102 | Ht 66.0 in | Wt 140.4 lb

## 2016-09-22 DIAGNOSIS — Z3042 Encounter for surveillance of injectable contraceptive: Secondary | ICD-10-CM

## 2016-09-22 LAB — POCT URINE PREGNANCY: Preg Test, Ur: NEGATIVE

## 2016-09-22 MED ORDER — MEDROXYPROGESTERONE ACETATE 150 MG/ML IM SUSP
150.0000 mg | Freq: Once | INTRAMUSCULAR | Status: AC
  Administered 2016-09-22: 150 mg via INTRAMUSCULAR

## 2016-09-22 MED ORDER — MEDROXYPROGESTERONE ACETATE 150 MG/ML IM SUSP: 150.0000 mg | INTRAMUSCULAR | 1 refills | Status: DC

## 2016-09-22 NOTE — Progress Notes (Signed)
GYN ENCOUNTER NOTE  Subjective:       Monica Wright is a 26 y.o. G32P1021 female here to restart Depo-Provera for pregnancy prevention.   Over the last six (6) years, she has used Depo-Provera and is "very familiar" with the drug.   Denies difficulty breathing or respiratory distress, chest pain, abdominal pain, unexplained vaginal bleeding, and leg pain or swelling.   No history of migraines, hypertension or diabetes.    Gynecologic History  Patient's last menstrual period was 09/01/2016 (exact date).  Contraception: none   Last Pap: 01/2014. Results were: normal  Obstetric History  OB History  Gravida Para Term Preterm AB Living  3 1 1   2 1   SAB TAB Ectopic Multiple Live Births  2     0 1    # Outcome Date GA Lbr Len/2nd Weight Sex Delivery Anes PTL Lv  3 Term 05/18/15 [redacted]w[redacted]d / 00:35 8 lb 0.4 oz (3.64 kg) F Vag-Spont EPI  LIV  2 SAB 2015          1 SAB 2009              Past Medical History:  Diagnosis Date  . H/O chlamydia infection 10/01/2014   S/p treatment.    . H/O chlamydia infection 10/01/2014   S/p treatment.    . Scoliosis   . 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 WISDOM TOOTH EXTRACTION Bilateral 2017    No current outpatient prescriptions on file prior to visit.   No current facility-administered medications on file prior to visit.     No Known Allergies  Social History   Social History  . Marital status: Single    Spouse name: N/A  . Number of children: N/A  . Years of education: N/A   Occupational History  . Not on file.   Social History Main Topics  . Smoking status: Current Every Day Smoker    Packs/day: 0.50    Types: Cigarettes    Last attempt to quit: 08/14/2014  . Smokeless tobacco: Never Used  . Alcohol use No  . Drug use: No  . Sexual activity: Yes    Birth control/ protection: None, Injection   Other Topics Concern  . Not on file   Social History Narrative  .  No narrative on file    Family History  Problem Relation Age of Onset  . Heart disease Neg Hx   . Drug abuse Neg Hx   . Cancer Neg Hx     The following portions of the patient's history were reviewed and updated as appropriate: allergies, current medications, past family history, past medical history, past social history, past surgical history and problem list.  Review of Systems  Review of Systems - Negative except as noted above.  History obtained from the patient.   Objective:   BP 91/61   Pulse (!) 102   Ht 5\' 6"  (1.676 m)   Wt 140 lb 6.4 oz (63.7 kg)   LMP 09/01/2016 (Exact Date)   Breastfeeding? No   BMI 22.66 kg/m   Alert and oriented x 4, no apparent distress.   UPT negative.   Physical exam: not indicated.   Assessment:   1. Encounter for Depo-Provera contraception  - POCT urine pregnancy  Plan:   Education given regarding options for contraception, including barrier methods, injectable contraception, IUD placement, oral contraceptives as well as associated benefits and risk including ACHES acroymn.  Pt familiar with Depo-Provera and would like to restart today. Rx: Depo-Provera, see orders.   Advised need for bone scan with prolonged Depo use. Pt will discuss further with Dr. Valentino Saxonherry during annual exam.  RTC for annual exam.   RTC x 10-12 weeks for next Depo-Provera injection.    [name], CNM   Procedure Note:  Date last pap: 01/2014. Last Depo-Provera: 06/26/2015. Side Effects if any: none. Serum HCG indicated? no. Depo-Provera 150 mg IM given by: Darol Destinerystal Miller, CMA. Next appointment due 12/08/2016.

## 2016-09-22 NOTE — Patient Instructions (Signed)
Bone Scan A bone scan is an imaging study of your bones. It is used to identify and diagnose bone problems. You may have this test to check for:  Cancer in your bones.  A broken or cracked bone.  Bone infection.  A cause of bone pain.  Certain other bone diseases.  For this test, a small amount of a radioactive substance (radiotracer) is injected into your blood. Your bones will absorb the radiotracer for a short time. The radiotracer gives off radioactive energy. This energy can be captured by a type of camera that makes images of your bones (scintigrams). Abnormal bones will take up too much or too little of the radiotracer. This will show up in the images. Tell a health care provider about:  Any allergies you have, including any previous reactions you have had during an exam that used radiotracer.  All medicines you are taking, including vitamins, herbs, eye drops, creams, and over-the-counter medicines.  Any blood disorders you have.  Any surgeries you have had.  Any medical conditions you have.  If you are pregnant or you think that you may be pregnant.  If you are breastfeeding. What are the risks? Generally, this is a safe procedure. However, problems may occur, including:  Exposure to radiation (a small amount).  Bleeding at the injection site.  Infection at the injection site. This is rare.  Allergicreaction to the radiotracer. Severe reactions may cause a rash or breathing trouble. These reactions are rare.  What happens before the procedure?  Ask your health care provider about changing or stopping your regular medicines. This is especially important if you are taking diabetes medicines or blood thinners.  Do not take any medicines that contain bismuth for 4 days before the scan.  Do not have X-rays that use barium contrast material for 4 days before the scan.  Do not wear metallic jewelry to the scan.  Do not drink very much for 4 hours before the scan.  At the beginning of the scan, you will need to drink several glasses of water. What happens during the procedure?  An IV tube will be inserted into one of your veins.  You will lie down on an exam table.  The radiotracer will be injected through the IV tube. You may feel a cold sensation in your arm.  Some pictures (images) may be taken right after the injection.  Then you will need to drink 6-8 glasses of water to flush the excess tracer out of your system.  You may have to wait several hours before more images are taken.  You will get back on the exam table for more images. ? The camera may move around your body. ? You may be asked to stay still or to change your position. The procedure may vary among health care providers and hospitals. What happens after the procedure?  You may have to wait until a nuclear medicine specialist (radiologist) checks the images to make sure that they are readable.  More images may be taken, if necessary.  You will be watched to make sure that you do not have a reaction to the procedure or the injected medicine.  It is your responsibility to obtain your test results. Ask your health care provider or the department performing the test when and how you will get your results.  Return to your normal activities as directed by your health care provider. This information is not intended to replace advice given to you by your health care  provider. Make sure you discuss any questions you have with your health care provider. Document Released: 03/07/2000 Document Revised: 07/19/2015 Document Reviewed: 01/11/2014 Elsevier Interactive Patient Education  2018 [employer]. Medroxyprogesterone injection [Contraceptive] What is this medicine? MEDROXYPROGESTERONE (me DROX ee proe JES te rone) contraceptive injections prevent pregnancy. They provide effective birth control for 3 months. Depo-subQ Provera 104 is also used for treating pain related to  endometriosis. This medicine may be used for other purposes; ask your health care provider or pharmacist if you have questions. COMMON BRAND NAME(S): Depo-Provera, Depo-subQ Provera 104 What should I tell my health care provider before I take this medicine? They need to know if you have any of these conditions: -frequently drink alcohol -asthma -blood vessel disease or a history of a blood clot in the lungs or legs -bone disease such as osteoporosis -breast cancer -diabetes -eating disorder (anorexia nervosa or bulimia) -high blood pressure -HIV infection or AIDS -kidney disease -liver disease -mental depression -migraine -seizures (convulsions) -stroke -tobacco smoker -vaginal bleeding -an unusual or allergic reaction to medroxyprogesterone, other hormones, medicines, foods, dyes, or preservatives -pregnant or trying to get pregnant -breast-feeding How should I use this medicine? Depo-Provera Contraceptive injection is given into a muscle. Depo-subQ Provera 104 injection is given under the skin. These injections are given by a health care professional. You must not be pregnant before getting an injection. The injection is usually given during the first 5 days after the start of a menstrual period or 6 weeks after delivery of a baby. Talk to your pediatrician regarding the use of this medicine in children. Special care may be needed. These injections have been used in female children who have started having menstrual periods. Overdosage: If you think you have taken too much of this medicine contact a poison control center or emergency room at once. NOTE: This medicine is only for you. Do not share this medicine with others. What if I miss a dose? Try not to miss a dose. You must get an injection once every 3 months to maintain birth control. If you cannot keep an appointment, call and reschedule it. If you wait longer than 13 weeks between Depo-Provera contraceptive injections or  longer than 14 weeks between Depo-subQ Provera 104 injections, you could get pregnant. Use another method for birth control if you miss your appointment. You may also need a pregnancy test before receiving another injection. What may interact with this medicine? Do not take this medicine with any of the following medications: -bosentan This medicine may also interact with the following medications: -aminoglutethimide -antibiotics or medicines for infections, especially rifampin, rifabutin, rifapentine, and griseofulvin -aprepitant -barbiturate medicines such as phenobarbital or primidone -bexarotene -carbamazepine -medicines for seizures like ethotoin, felbamate, oxcarbazepine, phenytoin, topiramate -modafinil -St. John's wort This list may not describe all possible interactions. Give your health care provider a list of all the medicines, herbs, non-prescription drugs, or dietary supplements you use. Also tell them if you smoke, drink alcohol, or use illegal drugs. Some items may interact with your medicine. What should I watch for while using this medicine? This drug does not protect you against HIV infection (AIDS) or other sexually transmitted diseases. Use of this product may cause you to lose calcium from your bones. Loss of calcium may cause weak bones (osteoporosis). Only use this product for more than 2 years if other forms of birth control are not right for you. The longer you use this product for birth control the more likely you will be  at risk for weak bones. Ask your health care professional how you can keep strong bones. You may have a change in bleeding pattern or irregular periods. Many females stop having periods while taking this drug. If you have received your injections on time, your chance of being pregnant is very low. If you think you may be pregnant, see your health care professional as soon as possible. Tell your health care professional if you want to get pregnant within  the next year. The effect of this medicine may last a long time after you get your last injection. What side effects may I notice from receiving this medicine? Side effects that you should report to your doctor or health care professional as soon as possible: -allergic reactions like skin rash, itching or hives, swelling of the face, lips, or tongue -breast tenderness or discharge -breathing problems -changes in vision -depression -feeling faint or lightheaded, falls -fever -pain in the abdomen, chest, groin, or leg -problems with balance, talking, walking -unusually weak or tired -yellowing of the eyes or skin Side effects that usually do not require medical attention (report to your doctor or health care professional if they continue or are bothersome): -acne -fluid retention and swelling -headache -irregular periods, spotting, or absent periods -temporary pain, itching, or skin reaction at site where injected -weight gain This list may not describe all possible side effects. Call your doctor for medical advice about side effects. You may report side effects to FDA at 1-800-FDA-1088. Where should I keep my medicine? This does not apply. The injection will be given to you by a health care professional. NOTE: This sheet is a summary. It may not cover all possible information. If you have questions about this medicine, talk to your doctor, pharmacist, or health care provider.  2018 Elsevier/Gold Standard (2008-03-31 18:37:56)

## 2016-09-23 ENCOUNTER — Encounter: Payer: 59 | Admitting: Obstetrics and Gynecology

## 2016-12-09 ENCOUNTER — Ambulatory Visit: Payer: 59

## 2016-12-17 ENCOUNTER — Encounter: Payer: 59 | Admitting: Obstetrics and Gynecology

## 2016-12-29 ENCOUNTER — Other Ambulatory Visit: Payer: Self-pay | Admitting: Obstetrics and Gynecology

## 2016-12-30 ENCOUNTER — Encounter: Payer: 59 | Admitting: Obstetrics and Gynecology

## 2017-01-06 ENCOUNTER — Encounter: Payer: Self-pay | Admitting: Obstetrics and Gynecology

## 2017-01-06 ENCOUNTER — Ambulatory Visit (INDEPENDENT_AMBULATORY_CARE_PROVIDER_SITE_OTHER): Payer: 59 | Admitting: Obstetrics and Gynecology

## 2017-01-06 VITALS — BP 105/65 | HR 109 | Ht 66.0 in | Wt 141.8 lb

## 2017-01-06 DIAGNOSIS — Z3042 Encounter for surveillance of injectable contraceptive: Secondary | ICD-10-CM | POA: Diagnosis not present

## 2017-01-06 DIAGNOSIS — Z124 Encounter for screening for malignant neoplasm of cervix: Secondary | ICD-10-CM | POA: Diagnosis not present

## 2017-01-06 DIAGNOSIS — Z01419 Encounter for gynecological examination (general) (routine) without abnormal findings: Secondary | ICD-10-CM | POA: Diagnosis not present

## 2017-01-06 LAB — POCT URINE PREGNANCY: Preg Test, Ur: NEGATIVE

## 2017-01-06 MED ORDER — MEDROXYPROGESTERONE ACETATE 150 MG/ML IM SUSP: 150.0000 mg | INTRAMUSCULAR | 4 refills | Status: DC

## 2017-01-06 NOTE — Progress Notes (Signed)
GYNECOLOGY ANNUAL PHYSICAL EXAM PROGRESS NOTE  Subjective:    Monica Wright is a 26 y.o. G23P1021 female who presents for an annual exam. The patient has no complaints today. The patient is sexually active.The patient wears seatbelts: yes. The patient participates in regular exercise: no. Has the patient ever been transfused or tattooed?: no. The patient reports that there is not domestic violence in her life.    Gynecologic History No LMP recorded. Patient has had an injection. Menstrual History: OB History    Gravida Para Term Preterm AB Living   SAB TAB Ectopic Multiple Live Births   2     0 1      Menarche age: 78 No LMP recorded. Patient has had an injection. Contraception: Depo-Provera injections History of STI's: H/o chlamydia 2016, treated Last Pap: 2015. Results were: normal.  Denies h/o abnormal pap smears.   Obstetric History   G3   P1   T1   P0   A2   L1    SAB2   TAB0   Ectopic0   Multiple0   Live Births1     # Outcome Date GA Lbr Len/2nd Weight Sex Delivery Anes PTL Lv  3 Term 05/18/15 [redacted]w[redacted]d / 00:35 8 lb 0.4 oz (3.64 kg) F Vag-Spont EPI  LIV     Name: Rauber,GIRL Zayana     Apgar1:  8                Apgar5: 9  2 SAB 2015          1 SAB 2009              Past Medical History:  Diagnosis Date  . H/O chlamydia infection 10/01/2014   S/p treatment.    . Scoliosis   . 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 WISDOM TOOTH EXTRACTION Bilateral 2017    Family History  Problem Relation Age of Onset  . Heart disease Neg Hx   . Drug abuse Neg Hx   . Cancer Neg Hx     Social History   Social History  . Marital status: Single    Spouse name: N/A  . Number of children: N/A  . Years of education: N/A   Occupational History  . Not on file.   Social History Main Topics  . Smoking status: Current Every Day Smoker    Packs/day: 0.50    Types: Cigarettes    Last attempt to quit:  08/14/2014  . Smokeless tobacco: Never Used  . Alcohol use No  . Drug use: No  . Sexual activity: Yes    Birth control/ protection: None, Injection   Other Topics Concern  . Not on file   Social History Narrative  . No narrative on file    No current outpatient prescriptions on file prior to visit.   No current facility-administered medications on file prior to visit.     No Known Allergies   Review of Systems Constitutional: negative for chills, fatigue, fevers and sweats Eyes: negative for irritation, redness and visual disturbance Ears, nose, mouth, throat, and face: negative for hearing loss, nasal congestion, snoring and tinnitus Respiratory: negative for asthma, cough, sputum Cardiovascular: negative for chest pain, dyspnea, exertional chest pressure/discomfort, irregular heart beat, palpitations and syncope Gastrointestinal: negative for abdominal pain, change in bowel habits, nausea and vomiting Genitourinary: negative for abnormal menstrual periods,  genital lesions, sexual problems and vaginal discharge, dysuria and urinary incontinence Integument/breast: negative for breast lump, breast tenderness and nipple discharge Hematologic/lymphatic: negative for bleeding and easy bruising Musculoskeletal:negative for back pain and muscle weakness Neurological: negative for dizziness, headaches, vertigo and weakness Endocrine: negative for diabetic symptoms including polydipsia, polyuria and skin dryness Allergic/Immunologic: negative for hay fever and urticaria        Objective:  Blood pressure 105/65, pulse (!) 109, height  (1.676 m), weight 141 lb 12.8 oz (64.3 kg), not currently breastfeeding. Body mass index is 22.89 kg/m.     General Appearance:    Alert, cooperative, no distress, appears stated age  Head:    Normocephalic, without obvious abnormality, atraumatic  Eyes:    PERRL, conjunctiva/corneas clear, EOM's intact, both eyes  Ears:    Normal external ear  canals, both ears  Nose:   Nares normal, septum midline, mucosa normal, no drainage or sinus tenderness  Throat:   Lips, mucosa, and tongue normal; teeth and gums normal  Neck:   Supple, symmetrical, trachea midline, no adenopathy; thyroid: no enlargement/tenderness/nodules; no carotid bruit or JVD  Back:     Symmetric, no curvature, ROM normal, no CVA tenderness  Lungs:     Clear to auscultation bilaterally, respirations unlabored  Chest Wall:    No tenderness or deformity   Heart:    Regular rate and rhythm, S1 and S2 normal, no murmur, rub or gallop  Breast Exam:    No tenderness, masses, or nipple abnormality  Abdomen:     Soft, non-tender, bowel sounds active all four quadrants, no masses, no organomegaly.    Genitalia:    Pelvic:external genitalia normal, vagina without lesions, discharge, or tenderness, rectovaginal septum  normal. Cervix normal in appearance, no cervical motion tenderness, no adnexal masses or tenderness.  Uterus normal size, shape, mobile, regular contours, nontender.  Rectal:    Normal external sphincter.  No hemorrhoids appreciated. Internal exam not done.   Extremities:   Extremities normal, atraumatic, no cyanosis or edema  Pulses:   2+ and symmetric all extremities  Skin:   Skin color, texture, turgor normal, no rashes or lesions  Lymph nodes:   Cervical, supraclavicular, and axillary nodes normal  Neurologic:   CNII-XII intact, normal strength, sensation and reflexes throughout   .  Labs:  Lab Results  Component Value Date   WBC 13.0 (H) 05/19/2015   HGB 12.8 05/19/2015   HCT 36.2 05/19/2015   MCV 94.2 05/19/2015   PLT 176 05/19/2015    No results found for: CREATININE, BUN, NA, K, CL, CO2  No results found for: ALT, AST, GGT, ALKPHOS, BILITOT  No results found for: TSH   Assessment:    Healthy female exam.   Contraception management.  Plan:     Blood tests: CBC with diff and Comprehensive metabolic panel. Breast self exam technique  reviewed and patient encouraged to perform self-exam monthly. Contraception: Depo-Provera injections.  Next injection due now.  To f/u tomorrow. Prescriptionsent to pharmacy. Discussed healthy lifestyle modifications. Pap smear performed, included STD screening on pap.  Already has received flu vaccine through work.  F/u q 3 months for injections, 1 year for annual exam.    [name], MD [facility]

## 2017-01-07 ENCOUNTER — Ambulatory Visit (INDEPENDENT_AMBULATORY_CARE_PROVIDER_SITE_OTHER): Payer: 59 | Admitting: Obstetrics and Gynecology

## 2017-01-07 ENCOUNTER — Encounter: Payer: Self-pay | Admitting: Obstetrics and Gynecology

## 2017-01-07 VITALS — BP 105/65 | HR 109 | Wt 141.0 lb

## 2017-01-07 DIAGNOSIS — Z3042 Encounter for surveillance of injectable contraceptive: Secondary | ICD-10-CM | POA: Diagnosis not present

## 2017-01-07 LAB — CBC
Hematocrit: 41.2 % (ref 34.0–46.6)
Hemoglobin: 13.5 g/dL (ref 11.1–15.9)
MCH: 31.3 pg (ref 26.6–33.0)
MCHC: 32.8 g/dL (ref 31.5–35.7)
MCV: 95 fL (ref 79–97)
Platelets: 276 10*3/uL (ref 150–379)
RBC: 4.32 x10E6/uL (ref 3.77–5.28)
RDW: 13 % (ref 12.3–15.4)
WBC: 6.5 10*3/uL (ref 3.4–10.8)

## 2017-01-07 LAB — COMPREHENSIVE METABOLIC PANEL
ALT: 14 IU/L (ref 0–32)
AST: 14 IU/L (ref 0–40)
Albumin/Globulin Ratio: 2 (ref 1.2–2.2)
Albumin: 4.6 g/dL (ref 3.5–5.5)
Alkaline Phosphatase: 74 IU/L (ref 39–117)
BUN/Creatinine Ratio: 13 (ref 9–23)
BUN: 9 mg/dL (ref 6–20)
Bilirubin Total: 0.4 mg/dL (ref 0.0–1.2)
CO2: 24 mmol/L (ref 20–29)
Calcium: 9.6 mg/dL (ref 8.7–10.2)
Chloride: 104 mmol/L (ref 96–106)
Creatinine, Ser: 0.7 mg/dL (ref 0.57–1.00)
GFR calc Af Amer: 137 mL/min/{1.73_m2} (ref >59)
GFR calc non Af Amer: 119 mL/min/{1.73_m2} (ref >59)
Globulin, Total: 2.3 g/dL (ref 1.5–4.5)
Glucose: 86 mg/dL (ref 65–99)
Potassium: 4.6 mmol/L (ref 3.5–5.2)
Sodium: 142 mmol/L (ref 134–144)
Total Protein: 6.9 g/dL (ref 6.0–8.5)

## 2017-01-07 MED ORDER — MEDROXYPROGESTERONE ACETATE 150 MG/ML IM SUSP
150.0000 mg | Freq: Once | INTRAMUSCULAR | Status: AC
  Administered 2017-01-07: 150 mg via INTRAMUSCULAR

## 2017-01-07 NOTE — Progress Notes (Signed)
Patient ID: Monica Wright, female   DOB: 02-21-1991, 26 y.o.   MRN: [id number] Pt presents for Depo-provera 150mg  injection for contraception. Was seen yesterday by Dr. Valentino Saxonherry. UPT-NEGATIVE on 01/06/2017. Pt has taken this medication before and is aware of side effects.

## 2017-01-07 NOTE — Progress Notes (Signed)
I have reviewed the record and concur with patient management and plan.  Brieanna Nau, MD [facility]     

## 2017-01-10 ENCOUNTER — Encounter: Payer: Self-pay | Admitting: Obstetrics and Gynecology

## 2017-01-10 LAB — PAP IG, CT-NG, RFX HPV ASCU
Chlamydia, Nuc. Acid Amp: NEGATIVE
Gonococcus by Nucleic Acid Amp: NEGATIVE
PAP Smear Comment: 0

## 2017-03-30 ENCOUNTER — Ambulatory Visit: Payer: 59

## 2017-04-03 ENCOUNTER — Ambulatory Visit: Payer: 59

## 2018-01-07 ENCOUNTER — Encounter: Payer: 59 | Admitting: Obstetrics and Gynecology

## 2018-03-02 ENCOUNTER — Other Ambulatory Visit: Payer: Self-pay | Admitting: Obstetrics and Gynecology

## 2018-03-03 NOTE — Telephone Encounter (Signed)
Called pt unable to speak to pt or LM due to the number on file being busy. Was calling pt to speak with her more about the depo provera refill request sent by the pharmacy. Pt has not seen AC or received any depo injection by the office since Dec 2018. [facility]

## 2018-04-06 ENCOUNTER — Other Ambulatory Visit: Payer: Self-pay | Admitting: Obstetrics and Gynecology

## 2018-04-06 NOTE — Telephone Encounter (Signed)
Patient needs appointment prior to next refill for annual exam

## 2018-04-30 NOTE — Telephone Encounter (Signed)
Called pt no answer the number was busy.

## 2018-06-25 NOTE — Telephone Encounter (Signed)
Called pt and received a message stating that it was unrecognized voicemail and I was unable to leave a message.

## 2018-06-29 NOTE — Telephone Encounter (Signed)
Called number on file operator stated that the number was not a recognizable mailbox. Was unable to leave a message or speak with pt. Mychart message was sent to pt.

## 2018-08-12 ENCOUNTER — Ambulatory Visit (INDEPENDENT_AMBULATORY_CARE_PROVIDER_SITE_OTHER): Payer: No Typology Code available for payment source | Admitting: Obstetrics and Gynecology

## 2018-08-12 ENCOUNTER — Other Ambulatory Visit: Payer: Self-pay

## 2018-08-12 ENCOUNTER — Encounter: Payer: Self-pay | Admitting: Obstetrics and Gynecology

## 2018-08-12 VITALS — BP 113/80 | HR 87 | Ht 67.0 in | Wt 162.5 lb

## 2018-08-12 DIAGNOSIS — N926 Irregular menstruation, unspecified: Secondary | ICD-10-CM | POA: Diagnosis not present

## 2018-08-12 DIAGNOSIS — Z3201 Encounter for pregnancy test, result positive: Secondary | ICD-10-CM

## 2018-08-12 LAB — POCT URINE PREGNANCY: Preg Test, Ur: POSITIVE — AB

## 2018-08-12 NOTE — Progress Notes (Signed)
    GYNECOLOGY CLINIC PROGRESS NOTE  Subjective:    Monica Wright is a 28 y.o. G24P1021 female who presents for evaluation of amenorrhea. She believes she could be pregnant. Pregnancy is desired. Sexual Activity: single partner, contraception: none. Discontinued birth control in January (Depo Provera).  Current symptoms also include: positive home pregnancy test. Last period was abnormal.     Patient's last menstrual period was 07/02/2018 (within days).  Mostly spotting.    The following portions of the patient's history were reviewed and updated as appropriate: allergies, current medications, past family history, past medical history, past social history, past surgical history and problem list.  Review of Systems A comprehensive review of systems was negative.     Objective:    BP 113/80   Pulse 87   Ht 5\' 7"  (1.702 m)   Wt 162 lb 8 oz (73.7 kg)   LMP 07/02/2018 (Within Days)   BMI 25.45 kg/m  General: alert, no distress and no acute distress    Lab Review Urine HCG: positive    Assessment:    Absence of menstruation.     Plan:    Pregnancy Test: Positive: EDC: 04/07/2018, EGA 5.6 weeks by dates. Briefly discussed pre-natal care options. Encouraged well-balanced diet, plenty of rest when needed, pre-natal vitamins daily and walking for exercise. Discussed self-help for nausea, avoiding OTC medications until consulting provider or pharmacist, other than Tylenol as needed, minimal caffeine (1-2 cups daily) and avoiding alcohol. She will schedule her initial OB visit in the next month. She will have a viability ultrasound in 1 week. Feel free to call with any questions.    [name], MD [facility]

## 2018-08-12 NOTE — Progress Notes (Signed)
PT is present today for confirmation of pregnancy. Pt LMP 07/02/18. UPT done today results were positive. Pt stated that she is doing well no complaints.

## 2018-08-19 ENCOUNTER — Other Ambulatory Visit: Payer: Self-pay

## 2018-08-19 ENCOUNTER — Ambulatory Visit (INDEPENDENT_AMBULATORY_CARE_PROVIDER_SITE_OTHER): Payer: No Typology Code available for payment source | Admitting: Obstetrics and Gynecology

## 2018-08-19 ENCOUNTER — Ambulatory Visit (INDEPENDENT_AMBULATORY_CARE_PROVIDER_SITE_OTHER): Payer: No Typology Code available for payment source

## 2018-08-19 VITALS — BP 106/59 | HR 70 | Wt 161.3 lb

## 2018-08-19 DIAGNOSIS — N8312 Corpus luteum cyst of left ovary: Secondary | ICD-10-CM | POA: Diagnosis not present

## 2018-08-19 DIAGNOSIS — Z1379 Encounter for other screening for genetic and chromosomal anomalies: Secondary | ICD-10-CM

## 2018-08-19 DIAGNOSIS — O3481 Maternal care for other abnormalities of pelvic organs, first trimester: Secondary | ICD-10-CM

## 2018-08-19 DIAGNOSIS — Z3A01 Less than 8 weeks gestation of pregnancy: Secondary | ICD-10-CM | POA: Diagnosis not present

## 2018-08-19 DIAGNOSIS — N926 Irregular menstruation, unspecified: Secondary | ICD-10-CM

## 2018-08-19 DIAGNOSIS — Z3201 Encounter for pregnancy test, result positive: Secondary | ICD-10-CM

## 2018-08-19 DIAGNOSIS — Z3481 Encounter for supervision of other normal pregnancy, first trimester: Secondary | ICD-10-CM

## 2018-08-19 NOTE — Progress Notes (Signed)
Monica Wright presents for NOB nurse interview visit. Pregnancy confirmation done [facility] 08/12/18 AC  G-4 .  P- 1 0 2  1   . Pregnancy education material explained and given. 0 cats in the home. NOB labs.. . PNV encouraged. Genetic screening options discussed. Genetic testing: Ordered/Declined/Unsure.  Pt may discuss with provider. Pt. To follow up with provider in _4 7/2/20_ weeks for NOB physical.  All questions answered. Patient will return to office for labs- CBC and Panorama- on 09/09/18

## 2018-08-20 ENCOUNTER — Encounter: Payer: Self-pay | Admitting: Obstetrics and Gynecology

## 2018-08-20 LAB — URINALYSIS, ROUTINE W REFLEX MICROSCOPIC
Bilirubin, UA: NEGATIVE
Glucose, UA: NEGATIVE
Ketones, UA: NEGATIVE
Leukocytes,UA: NEGATIVE
Nitrite, UA: NEGATIVE
Protein,UA: NEGATIVE
RBC, UA: NEGATIVE
Specific Gravity, UA: 1.028 (ref 1.005–1.030)
Urobilinogen, Ur: 0.2 mg/dL (ref 0.2–1.0)
pH, UA: 6 (ref 5.0–7.5)

## 2018-08-20 LAB — ABO AND RH: Rh Factor: POSITIVE

## 2018-08-20 LAB — HEPATITIS B SURFACE ANTIGEN: Hepatitis B Surface Ag: NEGATIVE

## 2018-08-20 LAB — RUBELLA SCREEN: Rubella Antibodies, IGG: 2.51 index (ref >0.99)

## 2018-08-20 LAB — VARICELLA ZOSTER ANTIBODY, IGG: Varicella zoster IgG: 319 index (ref >165)

## 2018-08-20 LAB — RPR: RPR Ser Ql: NONREACTIVE

## 2018-08-20 LAB — ANTIBODY SCREEN: Antibody Screen: NEGATIVE

## 2018-08-20 NOTE — Progress Notes (Signed)
I have reviewed the record and concur with patient management and plan.  Nashali Ditmer, MD [facility]     

## 2018-08-21 LAB — URINE CULTURE

## 2018-08-24 LAB — GC/CHLAMYDIA PROBE AMP
Chlamydia trachomatis, NAA: NEGATIVE
Neisseria Gonorrhoeae by PCR: NEGATIVE

## 2018-09-09 ENCOUNTER — Encounter: Payer: Self-pay | Admitting: Obstetrics and Gynecology

## 2018-09-09 ENCOUNTER — Other Ambulatory Visit: Payer: Self-pay

## 2018-09-09 ENCOUNTER — Telehealth: Payer: Self-pay | Admitting: Obstetrics and Gynecology

## 2018-09-09 ENCOUNTER — Other Ambulatory Visit: Payer: No Typology Code available for payment source

## 2018-09-09 DIAGNOSIS — Z3480 Encounter for supervision of other normal pregnancy, unspecified trimester: Secondary | ICD-10-CM

## 2018-09-09 NOTE — Telephone Encounter (Signed)
Patient would like the results from panorama given to [facility] in [place]) by phone [phone] or [phone]. Thanks

## 2018-09-09 NOTE — Telephone Encounter (Signed)
Pt was called no answer unable to leave voicemail was calling to informed her that it would 3-4 weeks before her test results would be in.

## 2018-09-09 NOTE — Telephone Encounter (Signed)
Pt called to explain to her more about the genetic testing. Pt was informed that the test takes about 7-10 business days and is shipped to [place]. Pt was given the option to take the [facility] genetic testing which takes less days for the results to come back. Pt was asking if she could do both. Pt was informed that her insurance would only pay for one and she would have to pay for the other one out of pocket.  Pt stated that she would call me back tomorrow with what she wanted to do.

## 2018-09-09 NOTE — Telephone Encounter (Signed)
The patient called and requested a call back from Monica Wright, No other information was disclosed. Please advise.

## 2018-09-09 NOTE — Telephone Encounter (Signed)
The patient called again and stated that she was told that she was told that her panorama results could take up to 3-4 weeks to receive results. The patient is wanting to know if she is able to do a different genetic test to get results sooner. Please advise.

## 2018-09-10 LAB — CBC
Hematocrit: 37.5 % (ref 34.0–46.6)
Hemoglobin: 12.9 g/dL (ref 11.1–15.9)
MCH: 31.9 pg (ref 26.6–33.0)
MCHC: 34.4 g/dL (ref 31.5–35.7)
MCV: 93 fL (ref 79–97)
Platelets: 255 10*3/uL (ref 150–450)
RBC: 4.04 x10E6/uL (ref 3.77–5.28)
RDW: 13.2 % (ref 11.7–15.4)
WBC: 6.9 10*3/uL (ref 3.4–10.8)

## 2018-09-22 ENCOUNTER — Telehealth: Payer: Self-pay

## 2018-09-22 ENCOUNTER — Telehealth: Payer: Self-pay | Admitting: Obstetrics and Gynecology

## 2018-09-22 NOTE — Telephone Encounter (Signed)
Coronavirus (COVID-19) Are you at risk?  Are you at risk for the Coronavirus (COVID-19)?  To be considered HIGH RISK for Coronavirus (COVID-19), you have to meet the following criteria:  . Traveled to [place], [place], [place], [place] or [place]; or in the [place] to [place], [place], [place], or [place]; and have fever, cough, and shortness of breath within the last 2 weeks of travel OR . Been in close contact with a person diagnosed with COVID-19 within the last 2 weeks and have fever, cough, and shortness of breath . IF YOU DO NOT MEET THESE CRITERIA, YOU ARE CONSIDERED LOW RISK FOR COVID-19.  What to do if you are HIGH RISK for COVID-19?  . If you are having a medical emergency, call 911. . Seek medical care right away. Before you go to a doctor's office, urgent care or emergency department, call ahead and tell them about your recent travel, contact with someone diagnosed with COVID-19, and your symptoms. You should receive instructions from your physician's office regarding next steps of care.  . When you arrive at healthcare provider, tell the healthcare staff immediately you have returned from visiting [place], [place], [place], [place] or [place]; or traveled in the [place] to [place], [place], [place], or [place]; in the last two weeks or you have been in close contact with a person diagnosed with COVID-19 in the last 2 weeks.   . Tell the health care staff about your symptoms: fever, cough and shortness of breath. . After you have been seen by a medical provider, you will be either: o Tested for (COVID-19) and discharged home on quarantine except to seek medical care if symptoms worsen, and asked to  - Stay home and avoid contact with others until you get your results (4-5 days)  - Avoid travel on public transportation if possible (such as bus, train, or airplane) or o Sent to the Emergency Department by EMS for evaluation, COVID-19 testing, and possible  admission depending on your condition and test results.  What to do if you are LOW RISK for COVID-19?  Reduce your risk of any infection by using the same precautions used for avoiding the common cold or flu:  . Wash your hands often with soap and warm water for at least 20 seconds.  If soap and water are not readily available, use an alcohol-based hand sanitizer with at least 60% alcohol.  . If coughing or sneezing, cover your mouth and nose by coughing or sneezing into the elbow areas of your shirt or coat, into a tissue or into your sleeve (not your hands). . Avoid shaking hands with others and consider head nods or verbal greetings only. . Avoid touching your eyes, nose, or mouth with unwashed hands.  . Avoid close contact with people who are sick. . Avoid places or events with large numbers of people in one location, like concerts or sporting events. . Carefully consider travel plans you have or are making. . If you are planning any travel outside or inside the [place], visit the CDC's Travelers' Health webpage for the latest health notices. . If you have some symptoms but not all symptoms, continue to monitor at home and seek medical attention if your symptoms worsen. . If you are having a medical emergency, call 911.   ADDITIONAL HEALTHCARE OPTIONS FOR PATIENTS  [facility] / e-Visit: [email or web address]         [facility]: [phone]  [facility]: [phone]                   [facility]: [phone]   

## 2018-09-22 NOTE — Telephone Encounter (Signed)
Called father in law no answer LM with information to call the office for genetic testing results.

## 2018-09-22 NOTE — Telephone Encounter (Signed)
The patient called and stated that she is no longer having a gender reveal party. And would like to speak with her nurse to go over her panorama results. Please advise.

## 2018-09-22 NOTE — Telephone Encounter (Signed)
Pt was informed of her genetic testing and the pt wanted to know the sex of the baby.

## 2018-09-22 NOTE — Telephone Encounter (Signed)
Spoke with pt and went over genetic testing. Pt stated that she would like for the sex of the baby to be given to her father in law. Number was provided along with his name Lakshmi Sundeen. [phone].

## 2018-09-23 ENCOUNTER — Ambulatory Visit (INDEPENDENT_AMBULATORY_CARE_PROVIDER_SITE_OTHER): Payer: No Typology Code available for payment source | Admitting: Obstetrics and Gynecology

## 2018-09-23 ENCOUNTER — Other Ambulatory Visit: Payer: Self-pay

## 2018-09-23 ENCOUNTER — Encounter: Payer: Self-pay | Admitting: Obstetrics and Gynecology

## 2018-09-23 VITALS — BP 100/70 | HR 90 | Ht 67.0 in | Wt 160.2 lb

## 2018-09-23 DIAGNOSIS — Z3481 Encounter for supervision of other normal pregnancy, first trimester: Secondary | ICD-10-CM

## 2018-09-23 DIAGNOSIS — Z3A12 12 weeks gestation of pregnancy: Secondary | ICD-10-CM

## 2018-09-23 LAB — POCT URINALYSIS DIPSTICK OB
Bilirubin, UA: NEGATIVE
Blood, UA: NEGATIVE
Glucose, UA: NEGATIVE
Ketones, UA: NEGATIVE
Leukocytes, UA: NEGATIVE
Nitrite, UA: NEGATIVE
POC,PROTEIN,UA: NEGATIVE
Spec Grav, UA: 1.015 (ref 1.010–1.025)
Urobilinogen, UA: 0.2 E.U./dL
pH, UA: 7 (ref 5.0–8.0)

## 2018-09-23 NOTE — Progress Notes (Signed)
OBSTETRIC INITIAL PRENATAL VISIT  Subjective:    Monica Wright is being seen today for her first obstetrical visit.  This is not a planned pregnancy. She is a 28 y.o. [id number] female at [redacted]w[redacted]d gestation, Estimated Date of Delivery: 04/06/19 with Patient's last menstrual period was 07/02/2018 (within days). consistent with 7 week sono. Her obstetrical history is significant for none. Relationship with FOB: spouse, living together. Patient does intend to breast feed. Pregnancy history fully reviewed.    OB History  Gravida Para Term Preterm AB Living  4 1 1  0 2 1  SAB TAB Ectopic Multiple Live Births  2 0 0 0 1    # Outcome Date GA Lbr Len/2nd Weight Sex Delivery Anes PTL Lv  4 Current           3 Term 05/18/15 [redacted]w[redacted]d / 00:35 8 lb 0.4 oz (3.64 kg) F Vag-Spont EPI  LIV     Name: Monica Wright     Apgar1: 8  Apgar5: 9  2 SAB 2015          1 SAB 2009            Gynecologic History:  Last pap smear was 12/2016.  Results were normal. Denies h/o abnormal pap smears in the past.  Reports history of STIs: H/o Chlamydia in 2016, treated Contraception: None. Discontinued OCPs in February 2020.   Past Medical History:  Diagnosis Date  . H/O chlamydia infection 10/01/2014   S/p treatment.    . Scoliosis   . 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 Healthy Mother   . Kidney Stones Mother   . Healthy Father   . Kidney Stones Sister   . Heart disease Neg Hx   . Drug abuse Neg Hx   . Cancer Neg Hx     Past Surgical History:  Procedure Laterality Date  . REFRACTIVE SURGERY    . WISDOM TOOTH EXTRACTION Bilateral 2017    Social History   Socioeconomic History  . Marital status: Single    Spouse name: Not on file  . Number of children: Not on file  . Years of education: Not on file  . Highest education level: Not on file  Occupational History  . Not on file  Social Needs  . Financial resource strain: Not on file  . Food  insecurity    Worry: Not on file    Inability: Not on file  . Transportation needs    Medical: Not on file    Non-medical: Not on file  Tobacco Use  . Smoking status: Former Smoker    Packs/day: 0.50    Types: Cigarettes    Quit date: 08/14/2014    Years since quitting: 4.1  . Smokeless tobacco: Never Used  Substance and Sexual Activity  . Alcohol use: No  . Drug use: No  . Sexual activity: Yes    Birth control/protection: None  Lifestyle  . Physical activity    Days per week: Not on file    Minutes per session: Not on file  . Stress: Not on file  Relationships  . Social Herbalist on phone: Not on file    Gets together: Not on file    Attends religious service: Not on file    Active member of club or organization: Not on file    Attends meetings of clubs or organizations: Not on file  Relationship status: Not on file  . Intimate partner violence    Fear of current or ex partner: Not on file    Emotionally abused: Not on file    Physically abused: Not on file    Forced sexual activity: Not on file  Other Topics Concern  . Not on file  Social History Narrative  . Not on file    Current Outpatient Medications on File Prior to Visit  Medication Sig Dispense Refill  . Prenatal Vit-Fe Fumarate-FA (PRENATAL MULTIVITAMIN) TABS tablet Take 1 tablet by mouth daily at 12 noon.     No current facility-administered medications on file prior to visit.     No Known Allergies    Review of Systems General: Not Present- Fever, Weight Loss and Weight Gain. Skin: Not Present- Rash. HEENT: Not Present- Blurred Vision, Headache and Bleeding Gums. Respiratory: Not Present- Difficulty Breathing. Breast: Not Present- Breast Mass. Cardiovascular: Not Present- Chest Pain, Elevated Blood Pressure, Fainting / Blacking Out and Shortness of Breath. Gastrointestinal: Not Present- Abdominal Pain, Constipation, Nausea and Vomiting. Female Genitourinary: Not Present- Frequency,  Painful Urination, Pelvic Pain, Vaginal Bleeding, Vaginal Discharge, Contractions, regular, Fetal Movements Decreased, Urinary Complaints and Vaginal Fluid. Musculoskeletal: Not Present- Back Pain and Leg Cramps. Neurological: Not Present- Dizziness. Psychiatric: Not Present- Depression.     Objective:   Blood pressure 100/70, pulse 90, height 5\' 7"  (1.702 m), weight 160 lb 3.2 oz (72.7 kg), last menstrual period 07/02/2018.  Body mass index is 25.09 kg/m.  General Appearance:    Alert, cooperative, no distress, appears stated age  Head:    Normocephalic, without obvious abnormality, atraumatic  Eyes:    PERRL, conjunctiva/corneas clear, EOM's intact, both eyes  Ears:    Normal external ear canals, both ears  Nose:   Nares normal, septum midline, mucosa normal, no drainage or sinus tenderness  Throat:   Lips, mucosa, and tongue normal; teeth and gums normal  Neck:   Supple, symmetrical, trachea midline, no adenopathy; thyroid: no enlargement/tenderness/nodules; no carotid bruit or JVD  Back:     Symmetric, no curvature, ROM normal, no CVA tenderness  Lungs:     Clear to auscultation bilaterally, respirations unlabored  Chest Wall:    No tenderness or deformity   Heart:    Regular rate and rhythm, S1 and S2 normal, no murmur, rub or gallop  Breast Exam:    No tenderness, masses, or nipple abnormality  Abdomen:     Soft, non-tender, bowel sounds active all four quadrants, no masses, no organomegaly.  FH 12 cm.  FHT 167  bpm.  Genitalia:    Pelvic:external genitalia normal, vagina without lesions, discharge, or tenderness, rectovaginal septum  normal. Cervix normal in appearance, no cervical motion tenderness, no adnexal masses or tenderness.  Pregnancy positive findings: uterine enlargement: 12 wk size, nontender.   Rectal:    Normal external sphincter.  No hemorrhoids appreciated. Internal exam not done.   Extremities:   Extremities normal, atraumatic, no cyanosis or edema  Pulses:   2+ and  symmetric all extremities  Skin:   Skin color, texture, turgor normal, no rashes or lesions  Lymph nodes:   Cervical, supraclavicular, and axillary nodes normal  Neurologic:   CNII-XII intact, normal strength, sensation and reflexes throughout     Assessment:    Pregnancy at 12 and 1/7 weeks    Plan:    Initial labs reviewed. Prenatal vitamins encouraged. Problem list reviewed and updated. New OB counseling:  The patient has been given  an overview regarding routine prenatal care.  Recommendations regarding diet, weight gain, and exercise in pregnancy were given. Prenatal testing, optional genetic testing, and ultrasound use in pregnancy were reviewed. Panorama results normal, female fetus.  Benefits of Breast Feeding were discussed. The patient is encouraged to consider nursing her baby post partum. Follow up in 4 weeks.  50% of 30 min visit spent on counseling and coordination of care.      Hildred Laserherry, Rayola Everhart, MD [facility]

## 2018-09-23 NOTE — Progress Notes (Signed)
NOB-PE pt present today for annual exam. Pt stated no issues at this time.

## 2018-10-21 ENCOUNTER — Other Ambulatory Visit: Payer: Self-pay

## 2018-10-21 ENCOUNTER — Ambulatory Visit (INDEPENDENT_AMBULATORY_CARE_PROVIDER_SITE_OTHER): Payer: No Typology Code available for payment source | Admitting: Obstetrics and Gynecology

## 2018-10-21 VITALS — BP 114/74 | HR 84 | Ht 67.0 in | Wt 164.5 lb

## 2018-10-21 DIAGNOSIS — Z3481 Encounter for supervision of other normal pregnancy, first trimester: Secondary | ICD-10-CM

## 2018-10-21 LAB — POCT URINALYSIS DIPSTICK OB
Bilirubin, UA: NEGATIVE
Blood, UA: NEGATIVE
Glucose, UA: NEGATIVE
Ketones, UA: NEGATIVE
Leukocytes, UA: NEGATIVE
Nitrite, UA: NEGATIVE
POC,PROTEIN,UA: NEGATIVE
Spec Grav, UA: 1.01 (ref 1.010–1.025)
Urobilinogen, UA: 0.2 E.U./dL
pH, UA: 7 (ref 5.0–8.0)

## 2018-10-21 NOTE — Progress Notes (Signed)
Patient comes in today for Tea visit. She would like to do the AFP test. No concerns.

## 2018-10-21 NOTE — Progress Notes (Signed)
ROB:  No complaints.  AFP today.  [place] next visit FAS.  Discussed breast feeding benefits - pt planning .

## 2018-10-21 NOTE — Addendum Note (Signed)
Addended by: Durwin Glaze on: 10/21/2018 10:27 AM   Modules accepted: Orders

## 2018-10-23 LAB — AFP, SERUM, OPEN SPINA BIFIDA
AFP MoM: 0.86
AFP Value: 26.5 ng/mL
Gest. Age on Collection Date: 16 weeks
Maternal Age At EDD: 30.2 yr
OSBR Risk 1 IN: 10000
Test Results:: NEGATIVE
Weight: 164 [lb_av]

## 2018-11-18 ENCOUNTER — Other Ambulatory Visit: Payer: Self-pay

## 2018-11-18 ENCOUNTER — Encounter: Payer: Self-pay | Admitting: Obstetrics and Gynecology

## 2018-11-18 ENCOUNTER — Ambulatory Visit (INDEPENDENT_AMBULATORY_CARE_PROVIDER_SITE_OTHER): Payer: No Typology Code available for payment source

## 2018-11-18 ENCOUNTER — Ambulatory Visit (INDEPENDENT_AMBULATORY_CARE_PROVIDER_SITE_OTHER): Payer: No Typology Code available for payment source | Admitting: Certified Nurse Midwife

## 2018-11-18 VITALS — BP 103/70 | HR 65 | Wt 166.7 lb

## 2018-11-18 DIAGNOSIS — Z3481 Encounter for supervision of other normal pregnancy, first trimester: Secondary | ICD-10-CM | POA: Diagnosis not present

## 2018-11-18 DIAGNOSIS — Z3482 Encounter for supervision of other normal pregnancy, second trimester: Secondary | ICD-10-CM

## 2018-11-18 DIAGNOSIS — Z3A20 20 weeks gestation of pregnancy: Secondary | ICD-10-CM

## 2018-11-18 LAB — POCT URINALYSIS DIPSTICK OB
Bilirubin, UA: NEGATIVE
Blood, UA: NEGATIVE
Glucose, UA: NEGATIVE
Ketones, UA: NEGATIVE
Leukocytes, UA: NEGATIVE
Nitrite, UA: NEGATIVE
POC,PROTEIN,UA: NEGATIVE
Spec Grav, UA: <=1.005 — AB (ref 1.010–1.025)
Urobilinogen, UA: 0.2 E.U./dL
pH, UA: 7 (ref 5.0–8.0)

## 2018-11-18 NOTE — Progress Notes (Signed)
ROB-Doing well, no questions or concerns. Anatomy scan today complete and normal, see below. Anticipatory guidance regarding course of prenatal care. Reviewed red flag symptoms and when to call. RTC x 4 weeks for ROB or sooner if needed  ULTRASOUND REPORT  Location: [facility] Date of Service: 11/18/2018   Indications:Anatomy Ultrasound Findings:  Singleton intrauterine pregnancy is visualized with FHR at 139 BPM. Biometrics give an (U/S) Gestational age of [redacted]w[redacted]d and an (U/S) EDD of 04/08/2019; this correlates with the clinically established Estimated Date of Delivery: 04/06/19  Fetal presentation is Variable.  EFW: 362 g ( 13 oz). Placenta: anterior. Grade: 1 AFI: subjectively normal.  Anatomic survey is complete and normal; Gender - female.    Right Ovary is normal in appearance. Left Ovary is normal appearance. Survey of the adnexa demonstrates no adnexal masses. There is no free peritoneal fluid in the cul de sac.  Impression: 1. [redacted]w[redacted]d Viable Singleton Intrauterine pregnancy by U/S. 2. (U/S) EDD is consistent with Clinically established Estimated Date of Delivery: 04/06/19 . 3. Normal Anatomy Scan

## 2018-11-18 NOTE — Progress Notes (Signed)
ROB-Pt present for routine prenatal care and anatomy scan. Pt stated that she was doing well no problems.  

## 2018-11-18 NOTE — Patient Instructions (Signed)
WHAT OB PATIENTS CAN EXPECT   Confirmation of pregnancy and ultrasound ordered if medically indicated-[redacted] weeks gestation  New OB (NOB) intake with nurse and New OB (NOB) labs- [redacted] weeks gestation  New OB (NOB) physical examination with provider- 11/[redacted] weeks gestation  Flu vaccine-[redacted] weeks gestation  Anatomy scan-[redacted] weeks gestation  Glucose tolerance test, blood work to test for anemia, T-dap vaccine-[redacted] weeks gestation  Vaginal swabs/cultures-STD/Group B strep-[redacted] weeks gestation  Appointments every 4 weeks until 28 weeks  Every 2 weeks from 28 weeks until 36 weeks  Weekly visits from 36 weeks until delivery  Back Pain in Pregnancy Back pain during pregnancy is common. Back pain may be caused by several factors that are related to changes during your pregnancy. Follow these instructions at home: Managing pain, stiffness, and swelling      If directed, for sudden (acute) back pain, put ice on the painful area. ? Put ice in a plastic bag. ? Place a towel between your skin and the bag. ? Leave the ice on for 20 minutes, 2-3 times per day.  If directed, apply heat to the affected area before you exercise. Use the heat source that your health care provider recommends, such as a moist heat pack or a heating pad. ? Place a towel between your skin and the heat source. ? Leave the heat on for 20-30 minutes. ? Remove the heat if your skin turns bright red. This is especially important if you are unable to feel pain, heat, or cold. You may have a greater risk of getting burned.  If directed, massage the affected area. Activity  Exercise as told by your health care provider. Gentle exercise is the best way to prevent or manage back pain.  Listen to your body when lifting. If lifting hurts, ask for help or bend your knees. This uses your leg muscles instead of your back muscles.  Squat down when picking up something from the floor. Do not bend over.  Only use bed rest for short  periods as told by your health care provider. Bed rest should only be used for the most severe episodes of back pain. Standing, sitting, and lying down  Do not stand in one place for long periods of time.  Use good posture when sitting. Make sure your head rests over your shoulders and is not hanging forward. Use a pillow on your lower back if necessary.  Try sleeping on your side, preferably the left side, with a pregnancy support pillow or 1-2 regular pillows between your legs. ? If you have back pain after a night's rest, your bed may be too soft. ? A firm mattress may provide more support for your back during pregnancy. General instructions  Do not wear high heels.  Eat a healthy diet. Try to gain weight within your health care provider's recommendations.  Use a maternity girdle, elastic sling, or back brace as told by your health care provider.  Take over-the-counter and prescription medicines only as told by your health care provider.  Work with a physical therapist or massage therapist to find ways to manage back pain. Acupuncture or massage therapy may be helpful.  Keep all follow-up visits as told by your health care provider. This is important. Contact a health care provider if:  Your back pain interferes with your daily activities.  You have increasing pain in other parts of your body. Get help right away if:  You develop numbness, tingling, weakness, or problems with the use of   your arms or legs.  You develop severe back pain that is not controlled with medicine.  You have a change in bowel or bladder control.  You develop shortness of breath, dizziness, or you faint.  You develop nausea, vomiting, or sweating.  You have back pain that is a rhythmic, cramping pain similar to labor pains. Labor pain is usually 1-2 minutes apart, lasts for about 1 minute, and involves a bearing down feeling or pressure in your pelvis.  You have back pain and your water breaks or  you have vaginal bleeding.  You have back pain or numbness that travels down your leg.  Your back pain developed after you fell.  You develop pain on one side of your back.  You see blood in your urine.  You develop skin blisters in the area of your back pain. Summary  Back pain may be caused by several factors that are related to changes during your pregnancy.  Follow instructions as told by your health care provider for managing pain, stiffness, and swelling.  Exercise as told by your health care provider. Gentle exercise is the best way to prevent or manage back pain.  Take over-the-counter and prescription medicines only as told by your health care provider.  Keep all follow-up visits as told by your health care provider. This is important. This information is not intended to replace advice given to you by your health care provider. Make sure you discuss any questions you have with your health care provider. Document Released: 06/18/2005 Document Revised: 06/29/2018 Document Reviewed: 08/26/2017 Elsevier Patient Education  2020 [employer] Round Ligament Pain  The round ligament is a cord of muscle and tissue that helps support the uterus. It can become a source of pain during pregnancy if it becomes stretched or twisted as the baby grows. The pain usually begins in the second trimester (13-28 weeks) of pregnancy, and it can come and go until the baby is delivered. It is not a serious problem, and it does not cause harm to the baby. Round ligament pain is usually a short, sharp, and pinching pain, but it can also be a dull, lingering, and aching pain. The pain is felt in the lower side of the abdomen or in the groin. It usually starts deep in the groin and moves up to the outside of the hip area. The pain may occur when you:  Suddenly change position, such as quickly going from a sitting to standing position.  Roll over in bed.  Cough or sneeze.  Do physical activity.  Follow these instructions at home:   Watch your condition for any changes.  When the pain starts, relax. Then try any of these methods to help with the pain: ? Sitting down. ? Flexing your knees up to your abdomen. ? Lying on your side with one pillow under your abdomen and another pillow between your legs. ? Sitting in a warm bath for 15-20 minutes or until the pain goes away.  Take over-the-counter and prescription medicines only as told by your health care provider.  Move slowly when you sit down or stand up.  Avoid long walks if they cause pain.  Stop or reduce your physical activities if they cause pain.  Keep all follow-up visits as told by your health care provider. This is important. Contact a health care provider if:  Your pain does not go away with treatment.  You feel pain in your back that you did not have before.  Your medicine   is not helping. Get help right away if:  You have a fever or chills.  You develop uterine contractions.  You have vaginal bleeding.  You have nausea or vomiting.  You have diarrhea.  You have pain when you urinate. Summary  Round ligament pain is felt in the lower abdomen or groin. It is usually a short, sharp, and pinching pain. It can also be a dull, lingering, and aching pain.  This pain usually begins in the second trimester (13-28 weeks). It occurs because the uterus is stretching with the growing baby, and it is not harmful to the baby.  You may notice the pain when you suddenly change position, when you cough or sneeze, or during physical activity.  Relaxing, flexing your knees to your abdomen, lying on one side, or taking a warm bath may help to get rid of the pain.  Get help from your health care provider if the pain does not go away or if you have vaginal bleeding, nausea, vomiting, diarrhea, or painful urination. This information is not intended to replace advice given to you by your health care provider. Make sure  you discuss any questions you have with your health care provider. Document Released: 12/18/2007 Document Revised: 08/26/2017 Document Reviewed: 08/26/2017 Elsevier Patient Education  2020 [employer]  

## 2018-12-16 ENCOUNTER — Encounter: Payer: Self-pay | Admitting: Obstetrics and Gynecology

## 2018-12-16 ENCOUNTER — Ambulatory Visit (INDEPENDENT_AMBULATORY_CARE_PROVIDER_SITE_OTHER): Payer: No Typology Code available for payment source | Admitting: Obstetrics and Gynecology

## 2018-12-16 ENCOUNTER — Other Ambulatory Visit: Payer: Self-pay

## 2018-12-16 VITALS — BP 118/69 | HR 94 | Wt 169.9 lb

## 2018-12-16 DIAGNOSIS — Z130 Encounter for screening for diseases of the blood and blood-forming organs and certain disorders involving the immune mechanism: Secondary | ICD-10-CM

## 2018-12-16 DIAGNOSIS — Z131 Encounter for screening for diabetes mellitus: Secondary | ICD-10-CM

## 2018-12-16 DIAGNOSIS — Z3482 Encounter for supervision of other normal pregnancy, second trimester: Secondary | ICD-10-CM

## 2018-12-16 LAB — POCT URINALYSIS DIPSTICK OB
Bilirubin, UA: NEGATIVE
Blood, UA: NEGATIVE
Glucose, UA: NEGATIVE
Ketones, UA: NEGATIVE
Leukocytes, UA: NEGATIVE
Nitrite, UA: NEGATIVE
POC,PROTEIN,UA: NEGATIVE
Spec Grav, UA: 1.01 (ref 1.010–1.025)
Urobilinogen, UA: 0.2 E.U./dL
pH, UA: 6.5 (ref 5.0–8.0)

## 2018-12-16 NOTE — Progress Notes (Signed)
ROB: Patient doing well, no complaints. Received flu vaccine at work several days ago.  RTC in 4 weeks, for 28 week labs at that time.

## 2018-12-16 NOTE — Progress Notes (Signed)
ROB-Pt present for routine prenatal care. Pt stated receiving flu vaccine at work on 12/13/18. Pt stated that she was doing well no problems.

## 2019-01-13 ENCOUNTER — Ambulatory Visit (INDEPENDENT_AMBULATORY_CARE_PROVIDER_SITE_OTHER): Payer: No Typology Code available for payment source | Admitting: Obstetrics and Gynecology

## 2019-01-13 ENCOUNTER — Encounter: Payer: Self-pay | Admitting: Obstetrics and Gynecology

## 2019-01-13 ENCOUNTER — Other Ambulatory Visit: Payer: Self-pay

## 2019-01-13 ENCOUNTER — Other Ambulatory Visit: Payer: No Typology Code available for payment source

## 2019-01-13 VITALS — BP 110/70 | HR 82 | Ht 67.0 in | Wt 180.3 lb

## 2019-01-13 DIAGNOSIS — Z23 Encounter for immunization: Secondary | ICD-10-CM

## 2019-01-13 DIAGNOSIS — Z130 Encounter for screening for diseases of the blood and blood-forming organs and certain disorders involving the immune mechanism: Secondary | ICD-10-CM

## 2019-01-13 DIAGNOSIS — Z3482 Encounter for supervision of other normal pregnancy, second trimester: Secondary | ICD-10-CM

## 2019-01-13 LAB — POCT URINALYSIS DIPSTICK OB
Bilirubin, UA: NEGATIVE
Blood, UA: NEGATIVE
Glucose, UA: NEGATIVE
Ketones, UA: NEGATIVE
Leukocytes, UA: NEGATIVE
Nitrite, UA: NEGATIVE
POC,PROTEIN,UA: NEGATIVE
Spec Grav, UA: 1.01 (ref 1.010–1.025)
Urobilinogen, UA: 0.2 E.U./dL
pH, UA: 6.5 (ref 5.0–8.0)

## 2019-01-13 NOTE — Progress Notes (Signed)
Patient comes in today for [place] visit. She feels like she is bigger than she should be and carrying low.

## 2019-01-13 NOTE — Progress Notes (Signed)
ROB:  1hr GCT today.  No complaints.

## 2019-01-13 NOTE — Addendum Note (Signed)
Addended by: Durwin Glaze on: 01/13/2019 11:46 AM   Modules accepted: Orders

## 2019-01-14 LAB — CBC
Hematocrit: 33 % — ABNORMAL LOW (ref 34.0–46.6)
Hemoglobin: 11.3 g/dL (ref 11.1–15.9)
MCH: 32 pg (ref 26.6–33.0)
MCHC: 34.2 g/dL (ref 31.5–35.7)
MCV: 94 fL (ref 79–97)
Platelets: 247 10*3/uL (ref 150–450)
RBC: 3.53 x10E6/uL — ABNORMAL LOW (ref 3.77–5.28)
RDW: 12.6 % (ref 11.7–15.4)
WBC: 9.3 10*3/uL (ref 3.4–10.8)

## 2019-01-14 LAB — GLUCOSE, 1 HOUR GESTATIONAL: Gestational Diabetes Screen: 144 mg/dL — ABNORMAL HIGH (ref 65–139)

## 2019-01-14 LAB — RPR: RPR Ser Ql: NONREACTIVE

## 2019-01-19 ENCOUNTER — Other Ambulatory Visit: Payer: Self-pay

## 2019-01-19 ENCOUNTER — Other Ambulatory Visit: Payer: No Typology Code available for payment source

## 2019-01-19 DIAGNOSIS — Z3482 Encounter for supervision of other normal pregnancy, second trimester: Secondary | ICD-10-CM

## 2019-01-19 DIAGNOSIS — Z131 Encounter for screening for diabetes mellitus: Secondary | ICD-10-CM

## 2019-01-20 LAB — GESTATIONAL GLUCOSE TOLERANCE
Glucose, Fasting: 87 mg/dL (ref 65–94)
Glucose, GTT - 1 Hour: 200 mg/dL — ABNORMAL HIGH (ref 65–179)
Glucose, GTT - 2 Hour: 145 mg/dL (ref 65–154)
Glucose, GTT - 3 Hour: 87 mg/dL (ref 65–139)

## 2019-01-26 ENCOUNTER — Telehealth: Payer: Self-pay

## 2019-01-26 NOTE — Telephone Encounter (Signed)
Pt called for results 3 hr GTT . Please return call to pt.

## 2019-01-27 NOTE — Telephone Encounter (Signed)
Please advise on results

## 2019-02-02 ENCOUNTER — Ambulatory Visit (INDEPENDENT_AMBULATORY_CARE_PROVIDER_SITE_OTHER): Payer: No Typology Code available for payment source | Admitting: Obstetrics and Gynecology

## 2019-02-02 ENCOUNTER — Other Ambulatory Visit: Payer: Self-pay

## 2019-02-02 ENCOUNTER — Encounter: Payer: Self-pay | Admitting: Obstetrics and Gynecology

## 2019-02-02 VITALS — BP 115/76 | HR 106 | Wt 178.8 lb

## 2019-02-02 DIAGNOSIS — O99810 Abnormal glucose complicating pregnancy: Secondary | ICD-10-CM

## 2019-02-02 DIAGNOSIS — Z3483 Encounter for supervision of other normal pregnancy, third trimester: Secondary | ICD-10-CM

## 2019-02-02 DIAGNOSIS — Z3A31 31 weeks gestation of pregnancy: Secondary | ICD-10-CM

## 2019-02-02 DIAGNOSIS — M549 Dorsalgia, unspecified: Secondary | ICD-10-CM

## 2019-02-02 DIAGNOSIS — O99891 Other specified diseases and conditions complicating pregnancy: Secondary | ICD-10-CM

## 2019-02-02 LAB — POCT URINALYSIS DIPSTICK OB
Bilirubin, UA: NEGATIVE
Blood, UA: NEGATIVE
Glucose, UA: NEGATIVE
Ketones, UA: NEGATIVE
Leukocytes, UA: NEGATIVE
Nitrite, UA: NEGATIVE
POC,PROTEIN,UA: NEGATIVE
Spec Grav, UA: <=1.005 — AB (ref 1.010–1.025)
Urobilinogen, UA: 0.2 E.U./dL
pH, UA: 7.5 (ref 5.0–8.0)

## 2019-02-02 NOTE — Progress Notes (Signed)
ROB-Pt present for routine prenatal care. Pt stated having lower abd and back pain.  

## 2019-02-02 NOTE — Patient Instructions (Signed)

## 2019-02-02 NOTE — Progress Notes (Signed)
ROB: Patient notes pelvic and back pain. Tried a pregnancy girdle but did not help. Recommended Tylenol, warm baths.  Desires to breastfeed.  See Ready Set Baby info below.  Desires tubal ligation, discussed all other methods of contraception, declines. Normal 3 hr glucola (1 value elevated).  Desires to go out of work around Jan 1 ([redacted] weeks gestation). Advised to discuss with job and if feasible, is ok.  RTC in 2 weeks.    The following were addressed during this visit:  Breastfeeding Education - Early initiation of breastfeeding    Comments: Keeps milk supply adequate, helps contract uterus and slow bleeding, and early milk is the perfect first food and is easy to digest.   - The importance of exclusive breastfeeding    Comments: Provides antibodies, Lower risk of breast and ovarian cancers, and type-2 diabetes,Helps your body recover, Reduced chance of SIDS.   - Risks of giving your baby anything other than breast milk if you are breastfeeding    Comments: Make the baby less content with breastfeeds, may make my baby more susceptible to illness, and may reduce my milk supply.   - Rooming-in on a 24-hour basis    Comments: Easier to learn baby's feeding cues, easier to bond and get to know each other, and encourages milk production.   - Feeding on demand or baby-led feeding    Comments: Helps prevent breastfeeding complications, helps bring in good milk supply, prevents under or overfeeding, and helps baby feel content and satisfied   - Frequent feeding to help assure optimal milk production    Comments: Making a full supply of milk requires frequent removal of milk from breasts, infant will eat 8-12 times in 24 hours, if separated from infant use breast massage, hand expression and/ or pumping to remove milk from breasts.   - Exclusive breastfeeding for the first 6 months    Comments: Builds a healthy milk supply and keeps it up, protects baby from sickness and disease, and  breastmilk has everything your baby needs for the first 6 months.

## 2019-02-22 ENCOUNTER — Other Ambulatory Visit: Payer: Self-pay

## 2019-02-22 ENCOUNTER — Encounter: Payer: Self-pay | Admitting: Obstetrics and Gynecology

## 2019-02-22 ENCOUNTER — Encounter: Payer: No Typology Code available for payment source | Admitting: Obstetrics and Gynecology

## 2019-02-22 ENCOUNTER — Ambulatory Visit (INDEPENDENT_AMBULATORY_CARE_PROVIDER_SITE_OTHER): Payer: No Typology Code available for payment source | Admitting: Obstetrics and Gynecology

## 2019-02-22 VITALS — BP 108/74 | HR 91 | Ht 67.0 in | Wt 182.6 lb

## 2019-02-22 DIAGNOSIS — Z3483 Encounter for supervision of other normal pregnancy, third trimester: Secondary | ICD-10-CM

## 2019-02-22 NOTE — Progress Notes (Signed)
ROB: Patient without complaints.  Denies contractions.  Some discharge likely consistent with mucous plug.  (Asymptomatic) 3-hour GTT discussed.

## 2019-02-22 NOTE — Progress Notes (Signed)
Patient comes in today for [place] visit.. No concerns today. She had a little pink mucus this morning.

## 2019-03-02 ENCOUNTER — Observation Stay
Admission: EM | Admit: 2019-03-02 | Discharge: 2019-03-02 | Disposition: A | Discharge status: Home / Self Care | Payer: No Typology Code available for payment source | Attending: Obstetrics and Gynecology | Admitting: Obstetrics and Gynecology

## 2019-03-02 DIAGNOSIS — O4703 False labor before 37 completed weeks of gestation, third trimester: Secondary | ICD-10-CM | POA: Diagnosis not present

## 2019-03-02 DIAGNOSIS — Z3A35 35 weeks gestation of pregnancy: Secondary | ICD-10-CM | POA: Insufficient documentation

## 2019-03-02 DIAGNOSIS — Z3490 Encounter for supervision of normal pregnancy, unspecified, unspecified trimester: Secondary | ICD-10-CM

## 2019-03-02 NOTE — OB Triage Note (Signed)
Discussed labor precautions and to return to hospital when contractions become stronger and closer together, leakage of fluid begins, vaginal bleeding starts, or decreased fetal movement occurs. Pt verbalized understanding and was happy to go home to rest.

## 2019-03-02 NOTE — OB Triage Note (Signed)
Pt presents to unit c/o contractions every 6-7 minutes since about 2100 tonight. Pt rates pain 4/10. Pt reports positive FM, denies LOF, vaginal bleeding. Vital signs WDL. Initial FHT 120s. Will continue to monitor.

## 2019-03-03 ENCOUNTER — Inpatient Hospital Stay
Admission: RE | Admit: 2019-03-03 | Discharge: 2019-03-03 | Disposition: A | Discharge status: Home / Self Care | Payer: No Typology Code available for payment source | Source: Ambulatory Visit

## 2019-03-03 ENCOUNTER — Encounter: Payer: No Typology Code available for payment source | Admitting: Obstetrics and Gynecology

## 2019-03-03 NOTE — Discharge Summary (Signed)
    L&D OB Triage Note  SUBJECTIVE Monica Wright is a 28 y.o. G72P1021 female at [redacted]w[redacted]d, EDD Estimated Date of Delivery: 04/06/19 who presented to triage with complaints of irregular contractions.  Denies rupture membranes leakage of fluid or vaginal bleeding.  Reports fetal movement.   OB History  Gravida Para Term Preterm AB Living  4 1 1  0 2 1  SAB TAB Ectopic Multiple Live Births  2 0 0 0 1    # Outcome Date GA Lbr Len/2nd Weight Sex Delivery Anes PTL Lv  4 Current           3 Term 05/18/15 [redacted]w[redacted]d / 00:35 3640 g F Vag-Spont EPI  LIV     Name: Medero,GIRL Eliana     Apgar1: 8  Apgar5: 9  2 SAB 2015          1 SAB 2009            No medications prior to admission.     OBJECTIVE  Nursing Evaluation:   BP 120/84 (BP Location: Right Arm)   Pulse 99   Temp 97.9 F (36.6 C) (Oral)   Resp 16   Ht 5\' 7"  (1.702 m)   Wt 82.6 kg   LMP 07/02/2018 (Within Days)   BMI 28.51 kg/m    Findings:   Reactive fetal monitor strip.  Negative CST  NST was performed and has been reviewed by me.  NST INTERPRETATION: Category I  Mode: External Baseline Rate (A): 130 bpm Variability: Moderate Accelerations: 15 x 15 Decelerations: None     Contraction Frequency (min): 2.5-4  ASSESSMENT Impression:  1.  Pregnancy:  [id number] at [redacted]w[redacted]d , EDD Estimated Date of Delivery: 04/06/19 2.  NST:  Category I  PLAN 1. Reassurance given 2. Discharge home with standard labor precautions given to return to L&D or call the office for problems. 3. Continue routine prenatal care.

## 2019-03-11 ENCOUNTER — Ambulatory Visit (INDEPENDENT_AMBULATORY_CARE_PROVIDER_SITE_OTHER): Payer: No Typology Code available for payment source | Admitting: Obstetrics and Gynecology

## 2019-03-11 ENCOUNTER — Other Ambulatory Visit: Payer: Self-pay

## 2019-03-11 ENCOUNTER — Encounter: Payer: Self-pay | Admitting: Obstetrics and Gynecology

## 2019-03-11 VITALS — BP 96/63 | HR 98 | Wt 183.7 lb

## 2019-03-11 DIAGNOSIS — Z3A36 36 weeks gestation of pregnancy: Secondary | ICD-10-CM

## 2019-03-11 DIAGNOSIS — Z113 Encounter for screening for infections with a predominantly sexual mode of transmission: Secondary | ICD-10-CM

## 2019-03-11 DIAGNOSIS — Z3483 Encounter for supervision of other normal pregnancy, third trimester: Secondary | ICD-10-CM

## 2019-03-11 DIAGNOSIS — Z3685 Encounter for antenatal screening for Streptococcus B: Secondary | ICD-10-CM

## 2019-03-11 LAB — POCT URINALYSIS DIPSTICK OB
Bilirubin, UA: NEGATIVE
Blood, UA: NEGATIVE
Glucose, UA: NEGATIVE
Ketones, UA: NEGATIVE
Leukocytes, UA: NEGATIVE
Nitrite, UA: NEGATIVE
POC,PROTEIN,UA: NEGATIVE
Spec Grav, UA: 1.01 (ref 1.010–1.025)
Urobilinogen, UA: 0.2 E.U./dL
pH, UA: 6.5 (ref 5.0–8.0)

## 2019-03-11 NOTE — Progress Notes (Signed)
ROB: Patient doing well, noting sometimes intense [place]. Is planning on epidural placement at some point in labor. 36 week labs done today.  Discussed natural remedies/exercises for cervical ripening/labor. RTC in 1 week.

## 2019-03-11 NOTE — Progress Notes (Signed)
ROB-Pt present for routine prenatal care and 36 week cultures. Pt stated having vaginal/lower abd pain and pressure.

## 2019-03-13 LAB — GC/CHLAMYDIA PROBE AMP
Chlamydia trachomatis, NAA: NEGATIVE
Neisseria Gonorrhoeae by PCR: NEGATIVE

## 2019-03-13 LAB — STREP GP B NAA+RFLX: Strep Gp B NAA+Rflx: NEGATIVE

## 2019-03-17 ENCOUNTER — Encounter: Payer: Self-pay | Admitting: Obstetrics and Gynecology

## 2019-03-17 ENCOUNTER — Ambulatory Visit (INDEPENDENT_AMBULATORY_CARE_PROVIDER_SITE_OTHER): Payer: No Typology Code available for payment source | Admitting: Obstetrics and Gynecology

## 2019-03-17 ENCOUNTER — Other Ambulatory Visit: Payer: Self-pay

## 2019-03-17 VITALS — BP 129/84 | HR 102 | Wt 184.1 lb

## 2019-03-17 DIAGNOSIS — Z3A37 37 weeks gestation of pregnancy: Secondary | ICD-10-CM

## 2019-03-17 DIAGNOSIS — Z3483 Encounter for supervision of other normal pregnancy, third trimester: Secondary | ICD-10-CM

## 2019-03-17 LAB — POCT URINALYSIS DIPSTICK OB
Bilirubin, UA: NEGATIVE
Blood, UA: NEGATIVE
Glucose, UA: NEGATIVE
Ketones, UA: NEGATIVE
Leukocytes, UA: NEGATIVE
Nitrite, UA: NEGATIVE
POC,PROTEIN,UA: NEGATIVE
Spec Grav, UA: 1.01 (ref 1.010–1.025)
Urobilinogen, UA: 0.2 E.U./dL
pH, UA: 7 (ref 5.0–8.0)

## 2019-03-17 NOTE — Progress Notes (Signed)
ROB: No problems-signs and symptoms of labor discussed.  Patient has occasional leg cramps-discussed potassium.

## 2019-03-17 NOTE — Progress Notes (Signed)
Patient comes in for [place] visit. No concerns today.

## 2019-03-22 ENCOUNTER — Encounter: Payer: Self-pay | Admitting: Obstetrics and Gynecology

## 2019-03-22 ENCOUNTER — Ambulatory Visit (INDEPENDENT_AMBULATORY_CARE_PROVIDER_SITE_OTHER): Payer: No Typology Code available for payment source | Admitting: Obstetrics and Gynecology

## 2019-03-22 ENCOUNTER — Other Ambulatory Visit: Payer: Self-pay

## 2019-03-22 VITALS — BP 110/75 | HR 108 | Wt 184.7 lb

## 2019-03-22 DIAGNOSIS — Z3A38 38 weeks gestation of pregnancy: Secondary | ICD-10-CM

## 2019-03-22 DIAGNOSIS — Z3483 Encounter for supervision of other normal pregnancy, third trimester: Secondary | ICD-10-CM

## 2019-03-22 DIAGNOSIS — M5431 Sciatica, right side: Secondary | ICD-10-CM

## 2019-03-22 LAB — POCT URINALYSIS DIPSTICK OB
Bilirubin, UA: NEGATIVE
Blood, UA: NEGATIVE
Glucose, UA: NEGATIVE
Ketones, UA: NEGATIVE
Leukocytes, UA: NEGATIVE
Nitrite, UA: NEGATIVE
POC,PROTEIN,UA: NEGATIVE
Spec Grav, UA: <=1.005 — AB (ref 1.010–1.025)
Urobilinogen, UA: 0.2 E.U./dL
pH, UA: 6.5 (ref 5.0–8.0)

## 2019-03-22 NOTE — Progress Notes (Signed)
ROB-Pt present for routine prenatal care. Pt stated having back pain. No other problems.

## 2019-03-22 NOTE — Patient Instructions (Signed)

## 2019-03-22 NOTE — Progress Notes (Signed)
ROB: Patient notes sciatic pain on the right. Discussed relief measures. Desires to discuss membrane stripping, advised that it could be performed after 39 weeks if cervix favorable. Normal 36 week labs. RTC in 1 week.

## 2019-03-25 NOTE — L&D Delivery Note (Signed)
Delivery Summary for Monica Wright November  Labor Events:   Preterm labor: No data found  Rupture date: 03/31/2019  Rupture time: 10:06 PM  Rupture type: Artificial  Fluid Color: Clear White Moderate Meconium  Induction: No data found  Augmentation: No data found  Complications: No data found  Cervical ripening: No data found No data found   No data found     Delivery:   Episiotomy: No data found  Lacerations: No data found  Repair suture: No data found  Repair # of packets: No data found  Blood loss (ml): 150   Information for the patient's newborn:  Monica Wright, Monica Wright [[id number]]    Delivery 04/01/2019 12:35 AM by  Vaginal, Spontaneous Sex:  female Gestational Age: [redacted]w[redacted]d Delivery Clinician:   Living?:         APGARS  One minute Five minutes Ten minutes  Skin color:        Heart rate:        Grimace:        Muscle tone:        Breathing:        Totals: 8  9      Presentation/position:      Resuscitation:   Cord information:    Disposition of cord blood:     Blood gases sent?  Complications:   Placenta: Delivered:       appearance Newborn Measurements: Weight: 8 lb 5 oz (3771 g)  Height: 20.75"  Head circumference:    Chest circumference:    Other providers:    Additional  information: Forceps:   Vacuum:   Breech:   Observed anomalies none       Delivery Note At 12:35 AM a viable and healthy female was delivered via Vaginal, Spontaneous (Presentation:  Vertex;  Direct OP position).  APGAR: 8, 9; weight 3771 grams.   Placenta status: spontaneously removed. Spontaneous, Intact.  Cord: 3-vessel, with the following complications: None.  Cord pH: not obtained.  Delayed cord clamping observed.   Anesthesia: Epidural Episiotomy: None Lacerations: None Suture Repair: None Est. Blood Loss (mL): 150  Mom to postpartum.  Baby to Couplet care / Skin to Skin.  [name], MD  04/01/2019, 1:09 AM

## 2019-03-26 ENCOUNTER — Encounter: Payer: Self-pay | Admitting: Obstetrics and Gynecology

## 2019-03-26 ENCOUNTER — Observation Stay
Admission: EM | Admit: 2019-03-26 | Discharge: 2019-03-26 | Disposition: A | Discharge status: Home / Self Care | Payer: No Typology Code available for payment source | Attending: Obstetrics and Gynecology | Admitting: Obstetrics and Gynecology

## 2019-03-26 DIAGNOSIS — Z3A38 38 weeks gestation of pregnancy: Secondary | ICD-10-CM | POA: Diagnosis not present

## 2019-03-26 DIAGNOSIS — O471 False labor at or after 37 completed weeks of gestation: Secondary | ICD-10-CM | POA: Diagnosis not present

## 2019-03-26 DIAGNOSIS — Z3490 Encounter for supervision of normal pregnancy, unspecified, unspecified trimester: Secondary | ICD-10-CM | POA: Diagnosis present

## 2019-03-26 NOTE — Discharge Instructions (Signed)
Return to hospital if any of the following occur:  -contractions at least every 5 minutes  -leakage of fluid -vaginal bleeding  -decreased fetal movement  -"rock hard" abdomen that does not relax

## 2019-03-26 NOTE — Discharge Summary (Signed)
    L&D OB Triage Note  SUBJECTIVE Monica Wright is a 29 y.o. G2P1021 female at [redacted]w[redacted]d, EDD Estimated Date of Delivery: 04/06/19 who presented to triage with complaints of contractions.  Denies bleeding or fluid leakage.   OB History  Gravida Para Term Preterm AB Living  4 1 1  0 2 1  SAB TAB Ectopic Multiple Live Births  2 0 0 0 1    # Outcome Date GA Lbr Len/2nd Weight Sex Delivery Anes PTL Lv  4 Current           3 Term 05/18/15 [redacted]w[redacted]d / 00:35 3640 g F Vag-Spont EPI  LIV     Name: Heck,GIRL Kyiesha     Apgar1: 8  Apgar5: 9  2 SAB 2015          1 SAB 2009            No medications prior to admission.     OBJECTIVE  Nursing Evaluation:   BP 139/83   Pulse (!) 104   Temp 97.8 F (36.6 C) (Oral)   Resp 18   Ht 5\' 7"  (1.702 m)   Wt 83.5 kg   LMP 07/02/2018 (Within Days)   BMI 28.82 kg/m    Findings:   irreg ctx   Not in labor  NST was performed and has been reviewed by me.  NST INTERPRETATION: Category I  Mode: External Baseline Rate (A): 130 bpm Variability: Moderate Accelerations: 15 x 15 Decelerations: None     Contraction Frequency (min): 2-3  ASSESSMENT Impression:  1.  Pregnancy:  at [redacted]w[redacted]d , EDD Estimated Date of Delivery: 04/06/19 2.  NST:  Category I  PLAN 1. Reassurance given 2. Discharge home with standard labor precautions given to return to L&D or call the office for problems. 3. Continue routine prenatal care.

## 2019-03-26 NOTE — OB Triage Note (Signed)
Pt arrived to unit c/o ctx that started earlier today around 1300. Pt states ctx have gone up in intensity over time, now pain at 6/10. Pt reports +FM, denies LOF and vaginal bleeding. Initial FHT 130s. Vital signs WDL. Will continue to monitor.

## 2019-03-26 NOTE — OB Triage Note (Signed)
Labor precautions discussed, all questions answered. Pt verbalized understanding. Pt left unit ambulatory.

## 2019-03-30 ENCOUNTER — Encounter: Payer: Self-pay | Admitting: Obstetrics and Gynecology

## 2019-03-30 ENCOUNTER — Other Ambulatory Visit: Payer: Self-pay | Admitting: Obstetrics and Gynecology

## 2019-03-30 ENCOUNTER — Ambulatory Visit (INDEPENDENT_AMBULATORY_CARE_PROVIDER_SITE_OTHER): Payer: No Typology Code available for payment source | Admitting: Obstetrics and Gynecology

## 2019-03-30 ENCOUNTER — Other Ambulatory Visit: Payer: Self-pay

## 2019-03-30 VITALS — BP 107/70 | HR 103 | Wt 187.2 lb

## 2019-03-30 DIAGNOSIS — Z3483 Encounter for supervision of other normal pregnancy, third trimester: Secondary | ICD-10-CM

## 2019-03-30 DIAGNOSIS — Z3A39 39 weeks gestation of pregnancy: Secondary | ICD-10-CM

## 2019-03-30 LAB — POCT URINALYSIS DIPSTICK OB
Bilirubin, UA: NEGATIVE
Blood, UA: NEGATIVE
Glucose, UA: NEGATIVE
Ketones, UA: NEGATIVE
Leukocytes, UA: NEGATIVE
Nitrite, UA: NEGATIVE
POC,PROTEIN,UA: NEGATIVE
Spec Grav, UA: 1.01 (ref 1.010–1.025)
Urobilinogen, UA: 0.2 E.U./dL
pH, UA: 6 (ref 5.0–8.0)

## 2019-03-30 NOTE — Progress Notes (Signed)
ROB: Patient notes irregular contractions. Seen in triage over the weekend for contractions.  Desires membrane sweeping today.  Given labor precautions.

## 2019-03-30 NOTE — Progress Notes (Signed)
ROB-Pt present for routine prenatal care. Pt stated that she went to the hospital on the 2nd of January due to having contractions that were 3 minutes apart. Pt stated that she is still having contractions with pain and pressure.

## 2019-03-31 ENCOUNTER — Inpatient Hospital Stay
Admission: EM | Admit: 2019-03-31 | Discharge: 2019-04-02 | DRG: 796 | Disposition: A | Discharge status: Home / Self Care | Payer: No Typology Code available for payment source | Attending: Obstetrics and Gynecology | Admitting: Obstetrics and Gynecology

## 2019-03-31 ENCOUNTER — Telehealth: Payer: Self-pay

## 2019-03-31 ENCOUNTER — Inpatient Hospital Stay: Payer: No Typology Code available for payment source | Admitting: Anesthesiology

## 2019-03-31 ENCOUNTER — Other Ambulatory Visit: Payer: Self-pay

## 2019-03-31 DIAGNOSIS — Z3A39 39 weeks gestation of pregnancy: Secondary | ICD-10-CM | POA: Diagnosis not present

## 2019-03-31 DIAGNOSIS — O9942 Diseases of the circulatory system complicating childbirth: Secondary | ICD-10-CM | POA: Diagnosis present

## 2019-03-31 DIAGNOSIS — O26893 Other specified pregnancy related conditions, third trimester: Secondary | ICD-10-CM | POA: Diagnosis present

## 2019-03-31 DIAGNOSIS — Z87891 Personal history of nicotine dependence: Secondary | ICD-10-CM

## 2019-03-31 DIAGNOSIS — Z20822 Contact with and (suspected) exposure to covid-19: Secondary | ICD-10-CM | POA: Diagnosis present

## 2019-03-31 DIAGNOSIS — I00 Rheumatic fever without heart involvement: Secondary | ICD-10-CM | POA: Diagnosis present

## 2019-03-31 DIAGNOSIS — Z302 Encounter for sterilization: Secondary | ICD-10-CM

## 2019-03-31 LAB — CBC
HCT: 35.6 % — ABNORMAL LOW (ref 36.0–46.0)
Hemoglobin: 12.1 g/dL (ref 12.0–15.0)
MCH: 30.3 pg (ref 26.0–34.0)
MCHC: 34 g/dL (ref 30.0–36.0)
MCV: 89.2 fL (ref 80.0–100.0)
Platelets: 217 10*3/uL (ref 150–400)
RBC: 3.99 MIL/uL (ref 3.87–5.11)
RDW: 13 % (ref 11.5–15.5)
WBC: 11.4 10*3/uL — ABNORMAL HIGH (ref 4.0–10.5)
nRBC: 0 % (ref 0.0–0.2)

## 2019-03-31 LAB — RESPIRATORY PANEL BY RT PCR (FLU A&B, COVID)
Influenza A by PCR: NEGATIVE
Influenza B by PCR: NEGATIVE
SARS Coronavirus 2 by RT PCR: NEGATIVE

## 2019-03-31 MED ORDER — FENTANYL 2.5 MCG/ML W/ROPIVACAINE 0.15% IN NS 100 ML EPIDURAL (ARMC): 12.0000 mL/h | EPIDURAL | Status: DC

## 2019-03-31 MED ORDER — DIPHENHYDRAMINE HCL 50 MG/ML IJ SOLN: 12.5000 mg | INTRAMUSCULAR | Status: DC | PRN

## 2019-03-31 MED ORDER — FENTANYL 2.5 MCG/ML W/ROPIVACAINE 0.15% IN NS 100 ML EPIDURAL (ARMC)
EPIDURAL | Status: AC
  Filled 2019-03-31: qty 100

## 2019-03-31 MED ORDER — LACTATED RINGERS IV SOLN: 500.0000 mL | INTRAVENOUS | Status: DC | PRN

## 2019-03-31 MED ORDER — BUTORPHANOL TARTRATE 1 MG/ML IJ SOLN: 1.0000 mg | INTRAMUSCULAR | Status: DC | PRN

## 2019-03-31 MED ORDER — OXYCODONE-ACETAMINOPHEN 5-325 MG PO TABS: 2.0000 | ORAL_TABLET | ORAL | Status: DC | PRN

## 2019-03-31 MED ORDER — OXYTOCIN 40 UNITS IN NORMAL SALINE INFUSION - SIMPLE MED
1.0000 m[IU]/min | INTRAVENOUS | Status: DC
  Administered 2019-03-31: 22:00:00 4 m[IU]/min via INTRAVENOUS

## 2019-03-31 MED ORDER — OXYCODONE-ACETAMINOPHEN 5-325 MG PO TABS: 1.0000 | ORAL_TABLET | ORAL | Status: DC | PRN

## 2019-03-31 MED ORDER — ONDANSETRON HCL 4 MG/2ML IJ SOLN: 4.0000 mg | Freq: Four times a day (QID) | INTRAMUSCULAR | Status: DC | PRN

## 2019-03-31 MED ORDER — OXYTOCIN 10 UNIT/ML IJ SOLN
INTRAMUSCULAR | Status: AC
  Filled 2019-03-31: qty 2

## 2019-03-31 MED ORDER — LIDOCAINE HCL (PF) 1 % IJ SOLN
30.0000 mL | INTRAMUSCULAR | Status: AC | PRN
  Administered 2019-03-31: 1.2 mL via SUBCUTANEOUS

## 2019-03-31 MED ORDER — EPHEDRINE 5 MG/ML INJ
10.0000 mg | INTRAVENOUS | Status: DC | PRN
  Filled 2019-03-31: qty 2

## 2019-03-31 MED ORDER — LACTATED RINGERS IV SOLN
INTRAVENOUS | Status: DC
  Administered 2019-03-31: 1000 mL via INTRAVENOUS

## 2019-03-31 MED ORDER — PHENYLEPHRINE 40 MCG/ML (10ML) SYRINGE FOR IV PUSH (FOR BLOOD PRESSURE SUPPORT)
80.0000 ug | PREFILLED_SYRINGE | INTRAVENOUS | Status: DC | PRN
  Filled 2019-03-31: qty 10

## 2019-03-31 MED ORDER — OXYTOCIN BOLUS FROM INFUSION
500.0000 mL | Freq: Once | INTRAVENOUS | Status: AC
  Administered 2019-04-01: 01:00:00 500 mL via INTRAVENOUS

## 2019-03-31 MED ORDER — LIDOCAINE-EPINEPHRINE (PF) 1.5 %-1:200000 IJ SOLN
INTRAMUSCULAR | Status: DC | PRN
  Administered 2019-03-31: 3 mL via EPIDURAL

## 2019-03-31 MED ORDER — SOD CITRATE-CITRIC ACID 500-334 MG/5ML PO SOLN: 30.0000 mL | ORAL | Status: DC | PRN

## 2019-03-31 MED ORDER — AMMONIA AROMATIC IN INHA
RESPIRATORY_TRACT | Status: AC
  Filled 2019-03-31: qty 10

## 2019-03-31 MED ORDER — FENTANYL CITRATE (PF) 100 MCG/2ML IJ SOLN: 100.0000 ug | INTRAMUSCULAR | Status: DC | PRN

## 2019-03-31 MED ORDER — MISOPROSTOL 200 MCG PO TABS
ORAL_TABLET | ORAL | Status: AC
  Filled 2019-03-31: qty 4

## 2019-03-31 MED ORDER — FENTANYL 2.5 MCG/ML W/ROPIVACAINE 0.15% IN NS 100 ML EPIDURAL (ARMC)
EPIDURAL | Status: DC | PRN
  Administered 2019-03-31: 12 mL/h via EPIDURAL

## 2019-03-31 MED ORDER — OXYTOCIN 40 UNITS IN NORMAL SALINE INFUSION - SIMPLE MED
2.5000 [IU]/h | INTRAVENOUS | Status: DC
  Filled 2019-03-31: qty 1000

## 2019-03-31 MED ORDER — LIDOCAINE HCL (PF) 1 % IJ SOLN
INTRAMUSCULAR | Status: AC
  Filled 2019-03-31: qty 30

## 2019-03-31 MED ORDER — TERBUTALINE SULFATE 1 MG/ML IJ SOLN: 0.2500 mg | Freq: Once | INTRAMUSCULAR | Status: DC | PRN

## 2019-03-31 MED ORDER — LACTATED RINGERS IV SOLN: 500.0000 mL | Freq: Once | INTRAVENOUS | Status: DC

## 2019-03-31 MED ORDER — ACETAMINOPHEN 325 MG PO TABS: 650.0000 mg | ORAL_TABLET | ORAL | Status: DC | PRN

## 2019-03-31 NOTE — Telephone Encounter (Signed)
Pt called in stating that she was having blood show. Pt stated that she noticed the blood show this morning and having contractions every 6 minutes. No intense pain. Pt was advised that if she noticed that her contractions are closer together with increased pain to please go to the ED. Pt stated that she understood.

## 2019-03-31 NOTE — H&P (Signed)
Obstetric History and Physical  Monica Wright is a 29 y.o. [id number] with IUP at [redacted]w[redacted]d presenting for complaints of contractions q 3 minutes. Patient states she has been having  regular, every 3 minutes contractions, minimal vaginal bleeding (bloody show), intact membranes, with active fetal movement.    Prenatal Course Source of Care: [facility] with onset of care at 7 weeks Pregnancy complications or risks: Patient Active Problem List   Diagnosis Date Noted  . Rheumatic fever without heart involvement 03/31/2019  . Pregnancy 03/02/2019  . Abnormal maternal glucose tolerance, antepartum 02/02/2019  . 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 Supervision of normal pregnancy 12/28/2014   She plans to breastfeed She desires no method, bilateral tubal ligation for postpartum contraception.   Prenatal labs and studies: ABO, Rh: A/Positive/-- (05/28 1045) Antibody: Negative (05/28 1045) Rubella: 2.51 (05/28 1045) RPR: Non Reactive (10/22 0942)  HBsAg: Negative (05/28 1045)  HIV:   Negative  GBS:--/Negative (12/18 1424) 1 hr Glucola  Abnormal (177) but 3 hr GTT normal.  Genetic screening normal Anatomy US normal   Past Medical History:  Diagnosis Date  . H/O chlamydia infection 10/01/2014   S/p treatment.    . Scoliosis   . 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 REFRACTIVE SURGERY    . WISDOM TOOTH EXTRACTION Bilateral 2017    OB History  Gravida Para Term Preterm AB Living  4 1 1   2 1   SAB TAB Ectopic Multiple Live Births  2     0 1    # Outcome Date GA Lbr Len/2nd Weight Sex Delivery Anes PTL Lv  4 Current           3 Term 05/18/15 [redacted]w[redacted]d / 00:35 3640 g F Vag-Spont EPI  LIV  2 SAB 2015          1 SAB 2009            Social History   Socioeconomic History  . Marital status: Married    Spouse name: Not on file  . Number of children: Not on file  . Years of education: Not on file  . Highest  education level: Not on file  Occupational History  . Not on file  Tobacco Use  . Smoking status: Former Smoker    Packs/day: 0.50    Types: Cigarettes    Quit date: 08/14/2014    Years since quitting: 4.6  . Smokeless tobacco: Never Used  Substance and Sexual Activity  . Alcohol use: No  . Drug use: No  . Sexual activity: Yes    Birth control/protection: None  Other Topics Concern  . Not on file  Social History Narrative  . Not on file   Social Determinants of Health   Financial Resource Strain:   . Difficulty of Paying Living Expenses: Not on file  Food Insecurity:   . Worried About 08/16/2014 in the Last Year: Not on file  . Ran Out of Food in the Last Year: Not on file  Transportation Needs:   . Lack of Transportation (Medical): Not on file  . Lack of Transportation (Non-Medical): Not on file  Physical Activity:   . Days of Exercise per Week: Not on file  . Minutes of Exercise per Session: Not on file  Stress:   . Feeling of Stress : Not on file  Social Connections:   . Frequency of Communication with Friends and  Family: Not on file  . Frequency of Social Gatherings with Friends and Family: Not on file  . Attends Religious Services: Not on file  . Active Member of Clubs or Organizations: Not on file  . Attends Archivist Meetings: Not on file  . Marital Status: Not on file    Family History  Problem Relation Age of Onset  . Healthy Mother   . Kidney Stones Mother   . Healthy Father   . Kidney Stones Sister   . Heart disease Neg Hx   . Drug abuse Neg Hx   . Cancer Neg Hx     Medications Prior to Admission  Medication Sig Dispense Refill Last Dose  . calcium carbonate (OS-CAL) 1250 (500 Ca) MG chewable tablet Chew 1 tablet by mouth daily.     . Prenatal Vit-Fe Fumarate-FA (PRENATAL MULTIVITAMIN) TABS tablet Take 1 tablet by mouth daily at 12 noon.       No Known Allergies  Review of Systems: Negative except for what is mentioned in  HPI.  Physical Exam: BP (!) 148/86 (BP Location: Left Arm)   Temp 98 F (36.7 C) (Oral)   Resp 12   LMP 07/02/2018 (Within Days) . Repeat BP 126/81. CONSTITUTIONAL: Well-developed, well-nourished female in no acute distress.  HENT:  Normocephalic, atraumatic, External right and left ear normal. Oropharynx is clear and moist EYES: Conjunctivae and EOM are normal. Pupils are equal, round, and reactive to light. No scleral icterus.  NECK: Normal range of motion, supple, no masses SKIN: Skin is warm and dry. No rash noted. Not diaphoretic. No erythema. No pallor. NEUROLOGIC: Alert and oriented to person, place, and time. Normal reflexes, muscle tone coordination. No cranial nerve deficit noted. PSYCHIATRIC: Normal mood and affect. Normal behavior. Normal judgment and thought content. CARDIOVASCULAR: Normal heart rate noted, regular rhythm RESPIRATORY: Effort and breath sounds normal, no problems with respiration noted ABDOMEN: Soft, nontender, nondistended, gravid. MUSCULOSKELETAL: Normal range of motion. No edema and no tenderness. 2+ distal pulses.  Cervical Exam: Dilatation 4 cm   Effacement 60-70%   Station -2   Presentation: cephalic FHT:  Baseline rate 140 bpm   Variability moderate  Accelerations present   Decelerations none Contractions: Every 2-4 mins   Pertinent Labs/Studies:   No results found for this or any previous visit (from the past 24 hour(s)).  Assessment : Monica Wright is a 29 y.o. [id number] at [redacted]w[redacted]d being admitted for labor.   Plan: Labor: Expectant management. Pitocin for augmentation as needed per protocol. Analgesia as needed.  Admission labs ordered.  FWB: Reassuring fetal heart tracing.  GBS negative Delivery plan: Hopeful for vaginal delivery.   Rubie Maid, MD [facility]

## 2019-03-31 NOTE — Anesthesia Procedure Notes (Signed)
Epidural Patient location during procedure: OB Start time: 03/31/2019 10:35 PM End time: 03/31/2019 10:57 PM  Preanesthetic Checklist Completed: patient identified, IV checked, site marked, risks and benefits discussed, surgical consent, monitors and equipment checked, pre-op evaluation and timeout performed  Epidural Patient position: sitting Prep: Betadine Patient monitoring: heart rate, continuous pulse ox and blood pressure Approach: midline Location: L4-L5 Injection technique: LOR saline  Needle:  Needle type: Tuohy  Needle gauge: 17 G Needle length: 9 cm and 9 Needle insertion depth: 8 cm Catheter type: closed end flexible Catheter size: 20 Guage Catheter at skin depth: 13 cm Test dose: negative and 1.5% lidocaine with Epi 1:200 K  Assessment Events: blood not aspirated, injection not painful, no injection resistance, no paresthesia and negative IV test  Additional Notes   Patient tolerated the insertion well without complications.Reason for block:procedure for pain

## 2019-03-31 NOTE — OB Triage Note (Signed)
Pt reports ctx every 3 mins and bloody show.

## 2019-03-31 NOTE — Anesthesia Preprocedure Evaluation (Signed)
Anesthesia Evaluation  Patient identified by MRN, date of birth, ID band Patient awake    Reviewed: Allergy & Precautions, NPO status , Patient's Chart, lab work & pertinent test results  History of Anesthesia Complications Negative for: history of anesthetic complications  Airway Mallampati: II       Dental   Pulmonary neg sleep apnea, neg COPD, Current Smoker,           Cardiovascular (-) hypertension(-) Past MI and (-) CHF (-) dysrhythmias (-) Valvular Problems/Murmurs     Neuro/Psych neg Seizures    GI/Hepatic Neg liver ROS, neg GERD  ,  Endo/Other  neg diabetes  Renal/GU negative Renal ROS     Musculoskeletal   Abdominal   Peds  Hematology   Anesthesia Other Findings   Reproductive/Obstetrics                             Anesthesia Physical Anesthesia Plan  ASA: II  Anesthesia Plan: Epidural   Post-op Pain Management:    Induction:   PONV Risk Score and Plan:   Airway Management Planned:   Additional Equipment:   Intra-op Plan:   Post-operative Plan:   Informed Consent: I have reviewed the patients History and Physical, chart, labs and discussed the procedure including the risks, benefits and alternatives for the proposed anesthesia with the patient or authorized representative who has indicated his/her understanding and acceptance.       Plan Discussed with:   Anesthesia Plan Comments:         Anesthesia Quick Evaluation

## 2019-03-31 NOTE — Progress Notes (Signed)
Patient AROM'd.  Moderate meconium.  Will notify Peds. Cervix 4.5/60/-2.  Pitocin initiated.   [name], MD [facility] 03/31/2019 10:09 PM

## 2019-04-01 ENCOUNTER — Other Ambulatory Visit: Payer: Self-pay

## 2019-04-01 ENCOUNTER — Encounter: Payer: Self-pay | Admitting: Obstetrics and Gynecology

## 2019-04-01 DIAGNOSIS — Z3A39 39 weeks gestation of pregnancy: Secondary | ICD-10-CM

## 2019-04-01 LAB — CBC
HCT: 33.2 % — ABNORMAL LOW (ref 36.0–46.0)
Hemoglobin: 11.3 g/dL — ABNORMAL LOW (ref 12.0–15.0)
MCH: 30.3 pg (ref 26.0–34.0)
MCHC: 34 g/dL (ref 30.0–36.0)
MCV: 89 fL (ref 80.0–100.0)
Platelets: 199 10*3/uL (ref 150–400)
RBC: 3.73 MIL/uL — ABNORMAL LOW (ref 3.87–5.11)
RDW: 12.8 % (ref 11.5–15.5)
WBC: 17 10*3/uL — ABNORMAL HIGH (ref 4.0–10.5)
nRBC: 0 % (ref 0.0–0.2)

## 2019-04-01 LAB — RPR: RPR Ser Ql: NONREACTIVE

## 2019-04-01 LAB — TYPE AND SCREEN
ABO/RH(D): A POS
Antibody Screen: NEGATIVE

## 2019-04-01 MED ORDER — ZOLPIDEM TARTRATE 5 MG PO TABS: 5.0000 mg | ORAL_TABLET | Freq: Every evening | ORAL | Status: DC | PRN

## 2019-04-01 MED ORDER — ONDANSETRON HCL 4 MG PO TABS: 4.0000 mg | ORAL_TABLET | ORAL | Status: DC | PRN

## 2019-04-01 MED ORDER — SENNOSIDES-DOCUSATE SODIUM 8.6-50 MG PO TABS
2.0000 | ORAL_TABLET | ORAL | Status: DC
  Administered 2019-04-02: 13:00:00 2 via ORAL
  Filled 2019-04-01: qty 2

## 2019-04-01 MED ORDER — BENZOCAINE-MENTHOL 20-0.5 % EX AERO
1.0000 "application " | INHALATION_SPRAY | CUTANEOUS | Status: DC | PRN
  Administered 2019-04-01: 1 via TOPICAL
  Filled 2019-04-01: qty 56

## 2019-04-01 MED ORDER — ONDANSETRON HCL 4 MG/2ML IJ SOLN
4.0000 mg | INTRAMUSCULAR | Status: DC | PRN
  Administered 2019-04-02: 4 mg via INTRAVENOUS

## 2019-04-01 MED ORDER — SIMETHICONE 80 MG PO CHEW: 80.0000 mg | CHEWABLE_TABLET | ORAL | Status: DC | PRN

## 2019-04-01 MED ORDER — PRENATAL MULTIVITAMIN CH
1.0000 | ORAL_TABLET | Freq: Every day | ORAL | Status: DC
  Administered 2019-04-01 – 2019-04-02 (×2): 1 via ORAL
  Filled 2019-04-01 (×2): qty 1

## 2019-04-01 MED ORDER — IBUPROFEN 800 MG PO TABS
800.0000 mg | ORAL_TABLET | Freq: Four times a day (QID) | ORAL | Status: DC
  Administered 2019-04-01: 18:00:00 800 mg via ORAL
  Filled 2019-04-01 (×2): qty 1

## 2019-04-01 MED ORDER — LACTATED RINGERS IV SOLN: INTRAVENOUS | Status: DC

## 2019-04-01 MED ORDER — ACETAMINOPHEN 325 MG PO TABS: 650.0000 mg | ORAL_TABLET | ORAL | Status: DC | PRN

## 2019-04-01 MED ORDER — DIPHENHYDRAMINE HCL 25 MG PO CAPS: 25.0000 mg | ORAL_CAPSULE | Freq: Four times a day (QID) | ORAL | Status: DC | PRN

## 2019-04-01 MED ORDER — WITCH HAZEL-GLYCERIN EX PADS: 1.0000 "application " | MEDICATED_PAD | CUTANEOUS | Status: DC | PRN

## 2019-04-01 MED ORDER — COCONUT OIL OIL
1.0000 "application " | TOPICAL_OIL | Status: DC | PRN
  Administered 2019-04-01: 1 via TOPICAL
  Filled 2019-04-01: qty 120

## 2019-04-01 MED ORDER — DIBUCAINE (PERIANAL) 1 % EX OINT: 1.0000 "application " | TOPICAL_OINTMENT | CUTANEOUS | Status: DC | PRN

## 2019-04-01 MED ORDER — FAMOTIDINE 20 MG PO TABS
40.0000 mg | ORAL_TABLET | Freq: Once | ORAL | Status: AC
  Administered 2019-04-02: 08:00:00 40 mg via ORAL
  Filled 2019-04-01: qty 2

## 2019-04-01 MED ORDER — METOCLOPRAMIDE HCL 10 MG PO TABS
10.0000 mg | ORAL_TABLET | Freq: Once | ORAL | Status: AC
  Administered 2019-04-02: 08:00:00 10 mg via ORAL
  Filled 2019-04-01: qty 1

## 2019-04-01 NOTE — Plan of Care (Addendum)
Vs stable; up ad lib now; tolerating regular diet; has declined motrin; breastfeeding and has declined assistance; planning on BTL on 04-02-19; please make sure it gets scheduled

## 2019-04-02 ENCOUNTER — Inpatient Hospital Stay: Payer: No Typology Code available for payment source | Admitting: Anesthesiology

## 2019-04-02 ENCOUNTER — Encounter
Admission: EM | Disposition: A | Discharge status: Home / Self Care | Payer: Self-pay | Source: Home / Self Care | Attending: Obstetrics and Gynecology

## 2019-04-02 DIAGNOSIS — Z302 Encounter for sterilization: Secondary | ICD-10-CM

## 2019-04-02 HISTORY — PX: TUBAL LIGATION: SHX77

## 2019-04-02 SURGERY — LIGATION, FALLOPIAN TUBE, POSTPARTUM
Anesthesia: General

## 2019-04-02 MED ORDER — ROCURONIUM BROMIDE 100 MG/10ML IV SOLN
INTRAVENOUS | Status: DC | PRN
  Administered 2019-04-02: 25 mg via INTRAVENOUS
  Administered 2019-04-02: 5 mg via INTRAVENOUS

## 2019-04-02 MED ORDER — EPHEDRINE SULFATE 50 MG/ML IJ SOLN
INTRAMUSCULAR | Status: AC
  Filled 2019-04-02: qty 1

## 2019-04-02 MED ORDER — ONDANSETRON HCL 4 MG/2ML IJ SOLN
INTRAMUSCULAR | Status: AC
  Filled 2019-04-02: qty 2

## 2019-04-02 MED ORDER — ROCURONIUM BROMIDE 50 MG/5ML IV SOLN
INTRAVENOUS | Status: AC
  Filled 2019-04-02: qty 1

## 2019-04-02 MED ORDER — KETOROLAC TROMETHAMINE 30 MG/ML IJ SOLN
30.0000 mg | Freq: Once | INTRAMUSCULAR | Status: AC
  Administered 2019-04-02: 10:00:00 30 mg via INTRAVENOUS

## 2019-04-02 MED ORDER — IBUPROFEN 800 MG PO TABS: 800.0000 mg | ORAL_TABLET | Freq: Three times a day (TID) | ORAL | 1 refills | Status: DC | PRN

## 2019-04-02 MED ORDER — MIDAZOLAM HCL 2 MG/2ML IJ SOLN
INTRAMUSCULAR | Status: AC
  Filled 2019-04-02: qty 2

## 2019-04-02 MED ORDER — ACETAMINOPHEN 325 MG PO TABS: 325.0000 mg | ORAL_TABLET | ORAL | Status: DC | PRN

## 2019-04-02 MED ORDER — LIDOCAINE HCL (PF) 2 % IJ SOLN
INTRAMUSCULAR | Status: AC
  Filled 2019-04-02: qty 10

## 2019-04-02 MED ORDER — DEXAMETHASONE SODIUM PHOSPHATE 10 MG/ML IJ SOLN
INTRAMUSCULAR | Status: DC | PRN
  Administered 2019-04-02: 4 mg via INTRAVENOUS

## 2019-04-02 MED ORDER — SUCCINYLCHOLINE CHLORIDE 20 MG/ML IJ SOLN
INTRAMUSCULAR | Status: AC
  Filled 2019-04-02: qty 1

## 2019-04-02 MED ORDER — KETOROLAC TROMETHAMINE 30 MG/ML IJ SOLN
INTRAMUSCULAR | Status: AC
  Filled 2019-04-02: qty 1

## 2019-04-02 MED ORDER — DEXAMETHASONE SODIUM PHOSPHATE 10 MG/ML IJ SOLN
INTRAMUSCULAR | Status: AC
  Filled 2019-04-02: qty 1

## 2019-04-02 MED ORDER — PROPOFOL 10 MG/ML IV BOLUS
INTRAVENOUS | Status: AC
  Filled 2019-04-02: qty 20

## 2019-04-02 MED ORDER — PROMETHAZINE HCL 25 MG/ML IJ SOLN
6.2500 mg | INTRAMUSCULAR | Status: DC | PRN
  Administered 2019-04-02: 10:00:00 6.25 mg via INTRAVENOUS

## 2019-04-02 MED ORDER — HYDROCODONE-ACETAMINOPHEN 5-325 MG PO TABS: 1.0000 | ORAL_TABLET | ORAL | Status: DC | PRN

## 2019-04-02 MED ORDER — HYDROCODONE-ACETAMINOPHEN 5-325 MG PO TABS: 1.0000 | ORAL_TABLET | Freq: Four times a day (QID) | ORAL | 0 refills | Status: DC | PRN

## 2019-04-02 MED ORDER — BUPIVACAINE HCL 0.5 % IJ SOLN
INTRAMUSCULAR | Status: DC | PRN
  Administered 2019-04-02: 10 mL

## 2019-04-02 MED ORDER — SODIUM CHLORIDE FLUSH 0.9 % IV SOLN
INTRAVENOUS | Status: AC
  Filled 2019-04-02: qty 10

## 2019-04-02 MED ORDER — PROMETHAZINE HCL 25 MG/ML IJ SOLN
INTRAMUSCULAR | Status: AC
  Filled 2019-04-02: qty 1

## 2019-04-02 MED ORDER — FENTANYL CITRATE (PF) 100 MCG/2ML IJ SOLN
INTRAMUSCULAR | Status: AC
  Filled 2019-04-02: qty 2

## 2019-04-02 MED ORDER — MIDAZOLAM HCL 2 MG/2ML IJ SOLN
INTRAMUSCULAR | Status: DC | PRN
  Administered 2019-04-02: 2 mg via INTRAVENOUS

## 2019-04-02 MED ORDER — ACETAMINOPHEN 160 MG/5ML PO SOLN
325.0000 mg | ORAL | Status: DC | PRN
  Filled 2019-04-02: qty 20.3

## 2019-04-02 MED ORDER — DOCUSATE SODIUM 100 MG PO CAPS: 100.0000 mg | ORAL_CAPSULE | Freq: Two times a day (BID) | ORAL | 2 refills | Status: DC | PRN

## 2019-04-02 MED ORDER — HYDROCODONE-ACETAMINOPHEN 5-325 MG PO TABS: 1.0000 | ORAL_TABLET | Freq: Four times a day (QID) | ORAL | 0 refills | Status: AC | PRN

## 2019-04-02 MED ORDER — PROPOFOL 10 MG/ML IV BOLUS
INTRAVENOUS | Status: DC | PRN
  Administered 2019-04-02: 200 mg via INTRAVENOUS

## 2019-04-02 MED ORDER — FENTANYL CITRATE (PF) 100 MCG/2ML IJ SOLN
25.0000 ug | INTRAMUSCULAR | Status: DC | PRN
  Administered 2019-04-02 (×2): 50 ug via INTRAVENOUS

## 2019-04-02 MED ORDER — SUCCINYLCHOLINE CHLORIDE 20 MG/ML IJ SOLN
INTRAMUSCULAR | Status: DC | PRN
  Administered 2019-04-02: 120 mg via INTRAVENOUS

## 2019-04-02 MED ORDER — SUGAMMADEX SODIUM 200 MG/2ML IV SOLN
INTRAVENOUS | Status: DC | PRN
  Administered 2019-04-02: 200 mg via INTRAVENOUS

## 2019-04-02 MED ORDER — FENTANYL CITRATE (PF) 100 MCG/2ML IJ SOLN
INTRAMUSCULAR | Status: DC | PRN
  Administered 2019-04-02: 100 ug via INTRAVENOUS

## 2019-04-02 SURGICAL SUPPLY — 24 items
BLADE SURG SZ11 CARB STEEL (BLADE) ×2 IMPLANT
CHLORAPREP W/TINT 26 (MISCELLANEOUS) ×2 IMPLANT
COVER WAND RF STERILE (DRAPES) IMPLANT
DERMABOND ADVANCED (GAUZE/BANDAGES/DRESSINGS) ×1
DERMABOND ADVANCED .7 DNX12 (GAUZE/BANDAGES/DRESSINGS) ×1 IMPLANT
DRAPE LAPAROTOMY 100X77 ABD (DRAPES) ×2 IMPLANT
GLOVE BIO SURGEON STRL SZ 6.5 (GLOVE) ×2 IMPLANT
GLOVE INDICATOR 7.0 STRL GRN (GLOVE) ×2 IMPLANT
GOWN STRL REUS W/ TWL LRG LVL3 (GOWN DISPOSABLE) ×2 IMPLANT
GOWN STRL REUS W/TWL LRG LVL3 (GOWN DISPOSABLE) ×2
KIT TURNOVER CYSTO (KITS) ×2 IMPLANT
NEEDLE HYPO 25GX1X1/2 BEV (NEEDLE) ×2 IMPLANT
NS IRRIG 500ML POUR BTL (IV SOLUTION) ×2 IMPLANT
PACK BASIN MINOR ARMC (MISCELLANEOUS) ×2 IMPLANT
SUT MNCRL 4-0 (SUTURE) ×1
SUT MNCRL 4-0 27XMFL (SUTURE) ×1
SUT PLAIN GUT 0 (SUTURE) ×4 IMPLANT
SUT VIC AB 0 CT1 36 (SUTURE) IMPLANT
SUT VIC AB 0 SH 27 (SUTURE) IMPLANT
SUT VIC AB 3-0 SH 27 (SUTURE) ×1
SUT VIC AB 3-0 SH 27X BRD (SUTURE) ×1 IMPLANT
SUT VICRYL 0 AB UR-6 (SUTURE) ×4 IMPLANT
SUTURE MNCRL 4-0 27XMF (SUTURE) ×1 IMPLANT
SYR 10ML LL (SYRINGE) ×2 IMPLANT

## 2019-04-02 NOTE — Transfer of Care (Signed)
Immediate Anesthesia Transfer of Care Note  Patient: Monica Wright  Procedure(s) Performed: POST PARTUM TUBAL LIGATION (N/A )  Patient Location: PACU  Anesthesia Type:General  Level of Consciousness: awake  Airway & Oxygen Therapy: Patient Spontanous Breathing  Post-op Assessment: Report given to RN  Post vital signs: stable  Last Vitals:  Vitals Value Taken Time  BP    Temp    Pulse    Resp    SpO2      Last Pain:  Vitals:   04/02/19 0805  TempSrc: Oral  PainSc:          Complications: No apparent anesthesia complications

## 2019-04-02 NOTE — Plan of Care (Signed)
Vs stable; up ad lib; NPO since midnight; having BTL today at 1015; a few meds due at 0815 (2 hours before BTL); breastfeeding and has not asked for any assistance; pt would like to be discharged after her BTL today

## 2019-04-02 NOTE — Progress Notes (Signed)
Post Partum Day # 1, s/p SVD  Subjective: no complaints, up ad lib, voiding and tolerating PO. Has been NPO since midnight for upcoming procedure.   Objective: Temp:  [97.6 F (36.4 C)-98.5 F (36.9 C)] 97.6 F (36.4 C) (01/09 0933) Pulse Rate:  [72-101] 101 (01/09 0933) Resp:  [16-20] 17 (01/09 0933) BP: (108-117)/(58-78) 114/69 (01/09 0933) SpO2:  [94 %-100 %] 94 % (01/09 0933)  Physical Exam:  General: alert and no distress  Lungs: clear to auscultation bilaterally Breasts: normal appearance, no masses or tenderness Heart: regular rate and rhythm, S1, S2 normal, no murmur, click, rub or gallop Abdomen: soft, non-tender; bowel sounds normal; no masses,  no organomegaly Pelvis: Lochia: appropriate, Uterine Fundus: firm Extremities: DVT Evaluation: No evidence of DVT seen on physical exam. Negative Homan's sign. No cords or calf tenderness. No significant calf/ankle edema.  Recent Labs    03/31/19 2127 04/01/19 0617  HGB 12.1 11.3*  HCT 35.6* 33.2*    Assessment/Plan: Doing well postpartum Breastfeeding well Circumcision done.  Contraception plans for PP tubal this morning.  Dispo: can d/c home later today as long as pain is controlled.     LOS: 2 days   [name], MD [facility] 04/02/2019 9:46 AM

## 2019-04-02 NOTE — Discharge Summary (Signed)
OB Discharge Summary     Patient Name: Monica Wright DOB: [birth year] MRN: [id number]  Date of admission: 03/31/2019 Delivering MD: Rubie Maid   Date of discharge: 04/02/2019  Admitting diagnosis:   Labor and delivery, indication for care Intrauterine pregnancy: [redacted]w[redacted]d     Secondary diagnosis:  Active Problems:   Rheumatic fever without heart involvement  Additional problems: None     Discharge diagnosis: Term Pregnancy Delivered                                                                                                Post partum procedures:postpartum tubal ligation  Augmentation: AROM and Pitocin  Complications: None  Hospital course:  Onset of Labor With Vaginal Delivery     29 y.o. yo [id number] at [redacted]w[redacted]d was admitted in Latent Labor on 03/31/2019. Patient had an uncomplicated labor course as follows:  Membrane Rupture Time/Date: 10:06 PM ,03/31/2019   Intrapartum Procedures: Episiotomy: None [1]                                         Lacerations:  None [1]  Patient had a delivery of a Viable infant. 04/01/2019  Delivery Summary for Monica Wright  Labor Events:   Preterm labor: No data found  Rupture date: 03/31/2019  Rupture time: 10:06 PM  Rupture type: Artificial  Fluid Color: Clear White Moderate Meconium  Induction: No data found  Augmentation: No data found  Complications: No data found  Cervical ripening: No data found No data found   No data found     Delivery:   Episiotomy: No data found  Lacerations: No data found  Repair suture: No data found  Repair # of packets: No data found  Blood loss (ml): 150   Information for the patient's newborn:  Ariela, Mochizuki [[id number]]    Delivery 04/01/2019 12:35 AM by  Vaginal, Spontaneous Sex:  female Gestational Age: [redacted]w[redacted]d Delivery Clinician:   Living?:         APGARS  One minute Five minutes Ten minutes  Skin color:        Heart rate:        Grimace:        Muscle tone:        Breathing:         Totals: 8  9      Presentation/position:      Resuscitation:   Cord information:    Disposition of cord blood:     Blood gases sent?  Complications:   Placenta: Delivered:       appearance Newborn Measurements: Weight: 8 lb 5 oz (3771 g)  Height: 20.75"  Head circumference:    Chest circumference:    Other providers:    Additional  information: Forceps:   Vacuum:   Breech:   Observed anomalies none      Pateint had an uncomplicated postpartum course.  She is ambulating, tolerating a regular diet, passing flatus, and urinating well. Patient is discharged  home in stable condition on 04/02/19.   Physical exam  Vitals:   04/01/19 2019 04/01/19 2330 04/02/19 0805 04/02/19 0933  BP: 115/63 (!) 108/58 111/78 114/69  Pulse: 79 77 72 (!) 101  Resp: 17 16 18 17   Temp: 97.8 F (36.6 C) 97.8 F (36.6 C) 97.8 F (36.6 C) 97.6 F (36.4 C)  TempSrc: Oral Oral Oral   SpO2: 99% 98% 100% 94%  Weight:      Height:       General: alert and no distress Lochia: appropriate Uterine Fundus: firm Incision: Healing well with no significant drainage, No significant erythema, Dressing is clean, dry, and intact DVT Evaluation: No evidence of DVT seen on physical exam. Negative Homan's sign. No cords or calf tenderness. No significant calf/ankle edema.   Labs: Lab Results  Component Value Date   WBC 17.0 (H) 04/01/2019   HGB 11.3 (L) 04/01/2019   HCT 33.2 (L) 04/01/2019   MCV 89.0 04/01/2019   PLT 199 04/01/2019    Discharge instruction: per After Visit Summary and "Baby and Me Booklet".  After visit meds:  Allergies as of 04/02/2019   No Known Allergies     Medication List    TAKE these medications   calcium carbonate 1250 (500 Ca) MG chewable tablet Commonly known as: OS-CAL Chew 1 tablet by mouth daily.   docusate sodium 100 MG capsule Commonly known as: COLACE Take 1 capsule (100 mg total) by mouth 2 (two) times daily as needed.   HYDROcodone-acetaminophen 5-325 MG  tablet Commonly known as: NORCO/VICODIN Take 1-2 tablets by mouth every 6 (six) hours as needed for up to 5 days.   ibuprofen 800 MG tablet Commonly known as: ADVIL Take 1 tablet (800 mg total) by mouth every 8 (eight) hours as needed.   prenatal multivitamin Tabs tablet Take 1 tablet by mouth daily at 12 noon.       Diet: routine diet  Activity: Advance as tolerated. Pelvic rest for 6 weeks.   Outpatient follow up:6 weeks Follow up Appt: Future Appointments  Date Time Provider Department Center  04/07/2019  8:00 AM [facility] 04/09/2019 [facility] None  04/07/2019  8:30 AM 04/09/2019, MD [facility] None   Follow up Visit:No follow-ups on file.  Postpartum contraception: Tubal Ligation  Newborn Data: Live born female  Birth Weight: 8 lb 5 oz (3771 g) APGAR: 8, 9  Newborn Delivery   Birth date/time: 04/01/2019 00:35:00 Delivery type: Vaginal, Spontaneous      Baby Feeding: Breast Disposition:home with mother   04/02/2019 05/31/2019, MD

## 2019-04-02 NOTE — Anesthesia Preprocedure Evaluation (Addendum)
Anesthesia Evaluation  Patient identified by MRN, date of birth, ID band Patient awake    Reviewed: Allergy & Precautions, H&P , NPO status , Patient's Chart, lab work & pertinent test results, reviewed documented beta blocker date and time   History of Anesthesia Complications Negative for: history of anesthetic complications  Airway Mallampati: II  TM Distance: >3 FB Neck ROM: full    Dental  (+) Teeth Intact   Pulmonary Current Smoker and Patient abstained from smoking., former smoker,    Pulmonary exam normal        Cardiovascular (-) hypertension(-) dysrhythmias      Neuro/Psych neg Seizures    GI/Hepatic Neg liver ROS,   Endo/Other  neg diabetes  Renal/GU negative Renal ROS     Musculoskeletal   Abdominal   Peds  Hematology   Anesthesia Other Findings Past Medical History: 10/01/2014: H/O chlamydia infection     Comment:  S/p treatment.   No date: Scoliosis 01/31/2015: Tobacco abuse, in remission Past Surgical History: No date: REFRACTIVE SURGERY 2017: WISDOM TOOTH EXTRACTION; Bilateral BMI    Body Mass Index: 29.32 kg/m     Reproductive/Obstetrics 1 day post partum                          Anesthesia Physical  Anesthesia Plan  ASA: II  Anesthesia Plan: General   Post-op Pain Management:    Induction: Intravenous  PONV Risk Score and Plan: 3 and Ondansetron and Treatment may vary due to age or medical condition  Airway Management Planned: Oral ETT  Additional Equipment:   Intra-op Plan:   Post-operative Plan: Extubation in OR  Informed Consent: I have reviewed the patients History and Physical, chart, labs and discussed the procedure including the risks, benefits and alternatives for the proposed anesthesia with the patient or authorized representative who has indicated his/her understanding and acceptance.     Dental Advisory Given  Plan Discussed with:  CRNA  Anesthesia Plan Comments:        Anesthesia Quick Evaluation

## 2019-04-02 NOTE — Progress Notes (Signed)
D/C instructions provided, pt states understanding, aware of follow up appt.  D/C home to car via auxiliary in wheelchair.  

## 2019-04-02 NOTE — Anesthesia Postprocedure Evaluation (Signed)
Anesthesia Post Note  Patient: Monica Wright  Procedure(s) Performed: AN AD HOC LABOR EPIDURAL  Patient location during evaluation: Mother Baby Anesthesia Type: Epidural Level of consciousness: awake and alert Pain management: pain level controlled Vital Signs Assessment: post-procedure vital signs reviewed and stable Respiratory status: spontaneous breathing, nonlabored ventilation and respiratory function stable Cardiovascular status: stable Postop Assessment: no headache, no backache and epidural receding (Mild soreness at insertion site) Anesthetic complications: no     Last Vitals:  Vitals:   04/01/19 2330 04/02/19 0805  BP: (!) 108/58 111/78  Pulse: 77 72  Resp: 16 18  Temp: 36.6 C 36.6 C  SpO2: 98% 100%    Last Pain:  Vitals:   04/02/19 0805  TempSrc: Oral  PainSc:                  Christia Reading

## 2019-04-02 NOTE — Op Note (Signed)
Procedure(s): POST PARTUM TUBAL LIGATION Procedure Note  Monica Wright female 29 y.o. 04/02/2019  Indications: The patient is a 29 y.o. [id number] multiparous female, PPD#1 s/p NSVD with undesired fertility. Other reversible forms of contraception were discussed with patient; she declines all other modalities. Risks of procedure discussed with patient including but not limited to: risk of regret, permanence of method, bleeding, infection, injury to surrounding organs and need for additional procedures.  Failure risk of 1-2 % with increased risk of ectopic gestation if pregnancy occurs and possibility of post-tubal pain syndrome also discussed with patient.   Pre-operative Diagnosis: postpartum multiparous female, undesired fertility  Post-operative Diagnosis: Same  Surgeon: [name], MD  Assistants:  None  Anesthesia: General endotracheal anesthesia  Findings: Normal appearing uterus, fallopian tubes, and ovaries.   Procedure Details: The patient was seen in the Holding Room. The risks, benefits, complications, treatment options, and expected outcomes were discussed with the patient.  The patient concurred with the proposed plan, giving informed consent.  The site of surgery properly noted/marked. The patient was taken to the Operating Room, identified as Monica Wright and the procedure verified as Procedure(s) (LRB): POST PARTUM TUBAL LIGATION (N/A).   She was then placed under general anesthesia without difficulty. She was placed in the dorsal lithotomy position, and was prepped and draped in a sterile manner.  A Time Out was held and the above information confirmed.  Attention was turned to the patient's abdomen where a small transverse skin incision was made under the umbilical fold. The incision was taken down to the layer of fascia using the scalpel, and fascia was incised, and extended bilaterally using Mayo scissors. The peritoneum was entered in a sharp fashion. Attention was  then turned to the patient's uterus, and left fallopian tube was identified and followed out to the fimbriated end. The Babcock clamp was then used to grasp the tube approximately 4 cm from the cornual region.  A 3 cm segment of tube was then ligated with a free tie of 0-Chromic using the Pomeroy method and excised.  The left fallopian tube was then ligated in a similar fashion and excised. The tubal lumens were cauterized bilaterally.  Good hemostasis was noted with bilateral fallopian tubes. The instruments were then removed from the patient's abdomen and the fascial incision was repaired with 0 Vicryl, and the skin was closed with a 4-0 Vicryl subcuticular stitch. Dermabond was placed over the incision. A total of 10 ml of 0.5% Sensorcaine was injected into the incision site and fascial site. The patient tolerated the procedure well.  Instrument, sponge, and needle counts were correct times three.  The patient was then taken to the recovery room awake and in stable condition.   Estimated Blood Loss:  minimal      Drains: patient voided prior to procedure.          Total IV Fluids:  600 ml  Specimens: Segments of left and right fallopian tubes         Implants: None         Complications:  None; patient tolerated the procedure well.         Disposition: PACU - hemodynamically stable.         Condition: stable   [name], MD [facility]

## 2019-04-03 NOTE — Anesthesia Postprocedure Evaluation (Signed)
Anesthesia Post Note  Patient: Monica Wright  Procedure(s) Performed: POST PARTUM TUBAL LIGATION (N/A )  Patient location during evaluation: PACU Anesthesia Type: General Level of consciousness: awake and alert Pain management: pain level controlled Vital Signs Assessment: post-procedure vital signs reviewed and stable Respiratory status: spontaneous breathing, nonlabored ventilation and respiratory function stable Cardiovascular status: blood pressure returned to baseline and stable Postop Assessment: no apparent nausea or vomiting Anesthetic complications: no     Last Vitals:  Vitals:   04/02/19 1058 04/02/19 1140  BP:  99/65  Pulse:  71  Resp:  20  Temp: 36.4 C 36.8 C  SpO2:  98%    Last Pain:  Vitals:   04/02/19 1440  TempSrc:   PainSc: 0-No pain                 Christia Reading

## 2019-04-05 LAB — SURGICAL PATHOLOGY

## 2019-04-07 ENCOUNTER — Other Ambulatory Visit: Payer: No Typology Code available for payment source

## 2019-04-07 ENCOUNTER — Encounter: Payer: No Typology Code available for payment source | Admitting: Obstetrics and Gynecology

## 2019-04-12 ENCOUNTER — Telehealth: Payer: Self-pay

## 2019-04-12 DIAGNOSIS — Z114 Encounter for screening for human immunodeficiency virus [HIV]: Secondary | ICD-10-CM

## 2019-04-12 NOTE — Telephone Encounter (Signed)
Pt marked she was + for HIV on her  short term disability forms. Needs recent proof that she does not have HIV.   Pls advise. Thanks

## 2019-04-13 NOTE — Telephone Encounter (Signed)
Spoke with pt and was informed that when she filled out her forms she accidentally marked HIV+ on her forms. Pt was assisted with making an appointment for lab.

## 2019-04-14 ENCOUNTER — Other Ambulatory Visit: Payer: No Typology Code available for payment source

## 2019-04-14 ENCOUNTER — Other Ambulatory Visit: Payer: Self-pay

## 2019-04-15 LAB — HIV ANTIBODY (ROUTINE TESTING W REFLEX): HIV Screen 4th Generation wRfx: NONREACTIVE

## 2019-04-19 ENCOUNTER — Telehealth: Payer: Self-pay

## 2019-04-19 NOTE — Telephone Encounter (Signed)
Pt is a [employer] employee wants to know if it's safe to get the vaccine with her breast feeding. Please call patient

## 2019-04-20 NOTE — Telephone Encounter (Signed)
Pt called no answer LM via VM that it was safe to take the COVID vaccine while breastfeeding. Also informed pt that we are not aware at this time of any long time side effect from the vaccine.

## 2019-05-24 ENCOUNTER — Encounter: Payer: No Typology Code available for payment source | Admitting: Obstetrics and Gynecology

## 2019-05-26 ENCOUNTER — Telehealth: Payer: Self-pay | Admitting: *Deleted

## 2019-05-26 NOTE — Telephone Encounter (Signed)
[employer] employee did not have her 2nd Covid 19 vaccine scheduled at the time of receiving her 1st Covid 19 vaccine on 05/20/19. She can be scheduled on/after 06/10/19. Will verify and call patient with information.

## 2019-05-31 ENCOUNTER — Ambulatory Visit (INDEPENDENT_AMBULATORY_CARE_PROVIDER_SITE_OTHER): Payer: No Typology Code available for payment source | Admitting: Obstetrics and Gynecology

## 2019-05-31 ENCOUNTER — Encounter: Payer: Self-pay | Admitting: Obstetrics and Gynecology

## 2019-05-31 ENCOUNTER — Other Ambulatory Visit: Payer: Self-pay

## 2019-05-31 DIAGNOSIS — Z9851 Tubal ligation status: Secondary | ICD-10-CM

## 2019-05-31 NOTE — Progress Notes (Signed)
   OBSTETRICS POSTPARTUM CLINIC PROGRESS NOTE  Subjective:     Monica Wright is a 29 y.o. [phone] female who presents for a postpartum visit. She is 6 weeks postpartum following a spontaneous vaginal delivery. I have fully reviewed the prenatal and intrapartum course. The delivery was at 39.w gestational weeks.  Anesthesia: epidural. Postpartum course has been well. Baby's course has been well. Baby is feeding by bottle - Enfamil NeuroPro. Bleeding: patient has not resumed menses, with No LMP recorded.. Bowel function is normal. Bladder function is normal. Patient is sexually active. Contraception method desired is tubal ligation (received postpartum). Postpartum depression screening: negative.  The following portions of the patient's history were reviewed and updated as appropriate: allergies, current medications, past family history, past medical history, past social history, past surgical history and problem list.  Review of Systems Pertinent items noted in HPI and remainder of comprehensive ROS otherwise negative.   Objective:    BP 132/82   Pulse 81   Ht 5\' 7"  (1.702 m)   Wt 165 lb (74.8 kg)   Breastfeeding No   BMI 25.84 kg/m   General:  alert and no distress   Breasts:  inspection negative, no nipple discharge or bleeding, no masses or nodularity palpable  Lungs: clear to auscultation bilaterally  Heart:  regular rate and rhythm, S1, S2 normal, no murmur, click, rub or gallop  Abdomen: soft, non-tender; bowel sounds normal; no masses,  no organomegaly.    Vulva:  normal  Vagina: normal vagina, no discharge, exudate, lesion, or erythema  Cervix:  no cervical motion tenderness and no lesions  Corpus: normal size, contour, position, consistency, mobility, non-tender  Adnexa:  normal adnexa and no mass, fullness, tenderness  Rectal Exam: Not performed.         Labs:  Lab Results  Component Value Date   HGB 11.3 (L) 04/01/2019     Assessment:   1. Postpartum care  following vaginal delivery 2. History of tubal ligation  Plan:   1. Contraception: tubal ligation 2. Follow up in: 4-6 months for annual exam, or sooner as needed.    05/30/2019, MD [facility]

## 2019-05-31 NOTE — Progress Notes (Signed)
  PT is present today for her postpartum visit. Pt stated that she is no longer breastfeeding due to the baby was not gaining weight.  EPDS=0 .  Pt stated that she is doing well no complaints.

## 2019-11-05 ENCOUNTER — Emergency Department
Admission: EM | Admit: 2019-11-05 | Discharge: 2019-11-05 | Disposition: A | Discharge status: Home / Self Care | Payer: PRIVATE HEALTH INSURANCE | Attending: Emergency Medicine | Admitting: Emergency Medicine

## 2019-11-05 ENCOUNTER — Other Ambulatory Visit: Payer: Self-pay

## 2019-11-05 DIAGNOSIS — S39012A Strain of muscle, fascia and tendon of lower back, initial encounter: Secondary | ICD-10-CM | POA: Insufficient documentation

## 2019-11-05 DIAGNOSIS — Y9389 Activity, other specified: Secondary | ICD-10-CM | POA: Insufficient documentation

## 2019-11-05 DIAGNOSIS — T148XXA Other injury of unspecified body region, initial encounter: Secondary | ICD-10-CM

## 2019-11-05 DIAGNOSIS — Y999 Unspecified external cause status: Secondary | ICD-10-CM | POA: Insufficient documentation

## 2019-11-05 DIAGNOSIS — Z87891 Personal history of nicotine dependence: Secondary | ICD-10-CM | POA: Insufficient documentation

## 2019-11-05 DIAGNOSIS — M545 Low back pain: Secondary | ICD-10-CM | POA: Diagnosis present

## 2019-11-05 DIAGNOSIS — W010XXA Fall on same level from slipping, tripping and stumbling without subsequent striking against object, initial encounter: Secondary | ICD-10-CM | POA: Insufficient documentation

## 2019-11-05 DIAGNOSIS — Y92531 Health care provider office as the place of occurrence of the external cause: Secondary | ICD-10-CM | POA: Diagnosis not present

## 2019-11-05 MED ORDER — CYCLOBENZAPRINE HCL 5 MG PO TABS: 5.0000 mg | ORAL_TABLET | Freq: Three times a day (TID) | ORAL | 0 refills | Status: DC | PRN

## 2019-11-05 NOTE — ED Triage Notes (Signed)
Pt reports that she was working the other day and was assisting with a pt moving and he jerked causing her to pull her back and has experienced pain in the left back. No pain in neck or low back, worse with movement

## 2019-11-05 NOTE — ED Provider Notes (Signed)
[facility] Emergency Department Provider Note  Time seen: 1:18 AM  I have reviewed the triage vital signs and the nursing notes.   HISTORY  Chief Complaint Back pain  HPI Monica Wright is a 29 y.o. female with no significant past medical history presents to the emergency department for upper back pain.  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  She attempted to catch the patient but pulled her upper back.  Patient states pain in the area of her shoulder blade and somewhat on the left side of her neck.  Patient states no improvement today so she checked in for evaluation.   Past Medical History:  Diagnosis Date  . H/O chlamydia infection 10/01/2014   S/p treatment.    . Scoliosis   . Tobacco abuse, in remission 01/31/2015    Patient Active Problem List   Diagnosis Date Noted  . Rheumatic fever without heart involvement 03/31/2019  . Pregnancy 03/02/2019  . Abnormal maternal glucose tolerance, antepartum 02/02/2019  . 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 Supervision of normal pregnancy 12/28/2014    Past Surgical History:  Procedure Laterality Date  . REFRACTIVE SURGERY    . TUBAL LIGATION N/A 04/02/2019   Procedure: POST PARTUM TUBAL LIGATION;  Surgeon: [name], MD;  Location: [facility] ORS;  Service: Gynecology;  Laterality: N/A;  . WISDOM TOOTH EXTRACTION Bilateral 2017    Prior to Admission medications   Medication Sig Start Date End Date Taking? Authorizing Provider  Prenatal Vit-Fe Fumarate-FA (PRENATAL MULTIVITAMIN) TABS tablet Take 1 tablet by mouth daily at 12 noon.    [provider]    No Known Allergies  Family History  Problem Relation Age of Onset  . Healthy Mother   . Kidney Stones Mother   . Healthy Father   . Kidney Stones Sister   . Heart disease Neg Hx   . Drug abuse Neg Hx   . Cancer Neg Hx     Social History Social History   Tobacco  Use  . Smoking status: Former Smoker    Packs/day: 0.50    Types: Cigarettes    Quit date: 08/14/2014    Years since quitting: 5.2  . Smokeless tobacco: Never Used  Vaping Use  . Vaping Use: Never used  Substance Use Topics  . Alcohol use: Yes    Comment: occass  . Drug use: No    Review of Systems Constitutional: Negative for fever. Cardiovascular: Negative for chest pain. Respiratory: Negative for shortness of breath. Gastrointestinal: Negative for abdominal pain Musculoskeletal: Positive for neck pain and upper back pain. Neurological: Negative for headache All other ROS negative  ____________________________________________   PHYSICAL EXAM:  VITAL SIGNS: ED Triage Vitals  Enc Vitals Group     BP      Pulse      Resp      Temp      Temp src      SpO2      Weight      Height      Head Circumference      Peak Flow      Pain Score      Pain Loc      Pain Edu?      Excl. in GC?    Constitutional: Alert and oriented. Well appearing and in no distress. Eyes: Normal exam ENT      Head: Normocephalic and atraumatic.  Mouth/Throat: Mucous membranes are moist. Cardiovascular: Normal rate, regular rhythm. Respiratory: Normal respiratory effort without tachypnea nor retractions. Breath sounds are clear  Gastrointestinal: Soft and nontender. No distention.   Musculoskeletal: Mild to moderate tenderness to palpation of the left paraspinal cervical muscles as well as tenderness over the left scapula and just medial to the left scapula.  No midline tenderness. Neurologic:  Normal speech and language. No gross focal neurologic deficits  Skin:  Skin is warm, dry and intact.  Psychiatric: Mood and affect are normal.  ____________________________________________    INITIAL IMPRESSION / ASSESSMENT AND PLAN / ED COURSE  Pertinent labs & imaging results that were available during my care of the patient were reviewed by me and considered in my medical decision making  (see chart for details).   Patient presents to the emergency department after an injury at work in which she was attempting to lift/move a patient.  Patient symptoms and exam is most consistent with muscular strain likely in the paraspinal as well as trapezius and possible rhomboid muscles.  No midline tenderness.  Do not believe imaging would be of much utility.  I recommended 600 mg ibuprofen every 8 hours over the next several days as needed for discomfort in addition we will prescribe Flexeril to be used for discomfort as well.  I discussed precautions as far as avoiding heavy lifting.  Patient agreeable to plan of care.  Monica Wright was evaluated in Emergency Department on 11/05/2019 for the symptoms described in the history of present illness. She was evaluated in the context of the global COVID-19 pandemic, which necessitated consideration that the patient might be at risk for infection with the SARS-CoV-2 virus that causes COVID-19. Institutional protocols and algorithms that pertain to the evaluation of patients at risk for COVID-19 are in a state of rapid change based on information released by regulatory bodies including the CDC and federal and state organizations. These policies and algorithms were followed during the patient's care in the ED.  ____________________________________________   FINAL CLINICAL IMPRESSION(S) / ED DIAGNOSES  Muscular strain   Minna Antis, MD 11/05/19 [id number]

## 2019-11-05 NOTE — ED Notes (Signed)
Per [facility] Anne, no UDS or other labs necessary for WC.

## 2019-11-11 ENCOUNTER — Encounter: Payer: No Typology Code available for payment source | Admitting: Obstetrics and Gynecology

## 2020-01-02 NOTE — Progress Notes (Deleted)
Pt present for annual exam. Pt stated  

## 2020-01-03 ENCOUNTER — Encounter: Payer: No Typology Code available for payment source | Admitting: Obstetrics and Gynecology

## 2020-01-05 ENCOUNTER — Encounter: Payer: No Typology Code available for payment source | Admitting: Obstetrics and Gynecology

## 2020-01-06 ENCOUNTER — Encounter: Payer: No Typology Code available for payment source | Admitting: Obstetrics and Gynecology

## 2020-03-22 ENCOUNTER — Encounter: Payer: No Typology Code available for payment source | Admitting: Obstetrics and Gynecology

## 2020-06-15 ENCOUNTER — Encounter: Payer: No Typology Code available for payment source | Admitting: Obstetrics and Gynecology

## 2020-06-28 ENCOUNTER — Encounter: Payer: Self-pay | Admitting: Obstetrics and Gynecology

## 2021-04-17 ENCOUNTER — Ambulatory Visit: Payer: No Typology Code available for payment source | Admitting: Pediatrics

## 2021-05-20 ENCOUNTER — Encounter: Payer: No Typology Code available for payment source | Admitting: Physician Assistant

## 2021-05-20 NOTE — Progress Notes (Signed)
Patient did not log on after provider waited over 15 min and sent link to email and mobile, but CC seems this visit should have been for her son. I did send a MyChart message advising that if the visit was for her son, she needed to schedule with his MyChart. Being that she did not log on for the visit, but that I feel the visit was scheduled in error anyway, I will mark this encounter erroneous.

## 2022-08-15 ENCOUNTER — Ambulatory Visit (INDEPENDENT_AMBULATORY_CARE_PROVIDER_SITE_OTHER): Payer: 59

## 2022-08-15 ENCOUNTER — Ambulatory Visit (INDEPENDENT_AMBULATORY_CARE_PROVIDER_SITE_OTHER): Payer: 59 | Admitting: Orthopaedic Surgery

## 2022-08-15 DIAGNOSIS — M79641 Pain in right hand: Secondary | ICD-10-CM

## 2022-08-15 NOTE — Progress Notes (Signed)
Chief Complaint: Right ring finger trigger finger     History of Present Illness:    Monica Wright is a 32 y.o. female right ring finger trigger with a bulbous appearing cyst in the A1 pulley region.  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.  She is here today for further discussion.  She has no frank triggering, really only pain    Surgical History:   None  PMH/PSH/Family History/Social History/Meds/Allergies:    Past Medical History:  Diagnosis Date  . H/O chlamydia infection 10/01/2014   S/p treatment.    . Scoliosis   . Tobacco abuse, in remission 01/31/2015   Past Surgical History:  Procedure Laterality Date  . REFRACTIVE SURGERY    . TUBAL LIGATION N/A 04/02/2019   Procedure: POST PARTUM TUBAL LIGATION;  Surgeon: [name], MD;  Location: [facility] ORS;  Service: Gynecology;  Laterality: N/A;  . WISDOM TOOTH EXTRACTION Bilateral 2017   Social History   Socioeconomic History  . Marital status: Married    Spouse name: Not on file  . Number of children: Not on file  . Years of education: Not on file  . Highest education level: Not on file  Occupational History  . Not on file  Tobacco Use  . Smoking status: Former    Packs/day: .5    Types: Cigarettes    Quit date: 08/14/2014    Years since quitting: 8.0  . Smokeless tobacco: Never  Vaping Use  . Vaping Use: Never used  Substance and Sexual Activity  . Alcohol use: Yes    Comment: occass  . Drug use: No  . Sexual activity: Yes    Birth control/protection: None  Other Topics Concern  . Not on file  Social History Narrative  . Not on file   Social Determinants of Health   Financial Resource Strain: Not on file  Food Insecurity: Not on file  Transportation Needs: Not on file  Physical Activity: Not on file  Stress: Not on file  Social Connections: Not on file   Family History  Problem Relation Age of Onset  .  Healthy Mother   . Kidney Stones Mother   . Healthy Father   . Kidney Stones Sister   . Heart disease Neg Hx   . Drug abuse Neg Hx   . Cancer Neg Hx    No Known Allergies Current Outpatient Medications  Medication Sig Dispense Refill  . cyclobenzaprine (FLEXERIL) 5 MG tablet Take 1 tablet (5 mg total) by mouth 3 (three) times daily as needed for muscle spasms. 20 tablet 0  . Prenatal Vit-Fe Fumarate-FA (PRENATAL MULTIVITAMIN) TABS tablet Take 1 tablet by mouth daily at 12 noon.     No current facility-administered medications for this visit.   No results found.  Review of Systems:   A ROS was performed including pertinent positives and negatives as documented in the HPI.  Physical Exam :   Constitutional: NAD and appears stated age Neurological: Alert and oriented Psych: Appropriate affect and cooperative There were no vitals taken for this visit.   Comprehensive Musculoskeletal Exam:    Tenderness to palpation about the A1 pulley of the right ring finger as well as the webspace between the ring finger middle finger  Imaging:  I personally reviewed and interpreted the radiographs.   Assessment:   32 y.o. female with right ring finger trigger finger causing pain.  At this time I recommend an ultrasound-guided injection in this area.  She would like to proceed with this today.  I will plan to see her back as needed  Plan :    -Return to clinic as needed    Procedure Note  Patient: Monica Wright             Date of Birth: 1990/08/21           MRN: [id number]             Visit Date: 08/15/2022  Procedures: Visit Diagnoses:  1. Pain in right hand     Small Joint Inj on 08/15/2022 12:27 PM         I personally saw and evaluated the patient, and participated in the management and treatment plan.  Huel Cote, MD Attending Physician, Orthopedic Surgery  This document was dictated using Dragon voice recognition software. A reasonable attempt at  proof reading has been made to minimize errors.

## 2023-02-26 DIAGNOSIS — M25512 Pain in left shoulder: Secondary | ICD-10-CM | POA: Diagnosis not present

## 2023-02-26 DIAGNOSIS — M546 Pain in thoracic spine: Secondary | ICD-10-CM | POA: Diagnosis not present

## 2023-02-26 DIAGNOSIS — Z8673 Personal history of transient ischemic attack (TIA), and cerebral infarction without residual deficits: Secondary | ICD-10-CM | POA: Diagnosis not present

## 2023-02-26 DIAGNOSIS — S46912A Strain of unspecified muscle, fascia and tendon at shoulder and upper arm level, left arm, initial encounter: Secondary | ICD-10-CM | POA: Diagnosis not present

## 2023-02-26 DIAGNOSIS — M79602 Pain in left arm: Secondary | ICD-10-CM | POA: Diagnosis not present

## 2023-02-26 DIAGNOSIS — M549 Dorsalgia, unspecified: Secondary | ICD-10-CM | POA: Diagnosis not present

## 2023-02-26 DIAGNOSIS — F17210 Nicotine dependence, cigarettes, uncomplicated: Secondary | ICD-10-CM | POA: Diagnosis not present

## 2024-03-09 ENCOUNTER — Telehealth: Admitting: Physician Assistant

## 2024-03-09 DIAGNOSIS — J101 Influenza due to other identified influenza virus with other respiratory manifestations: Secondary | ICD-10-CM | POA: Diagnosis not present

## 2024-03-09 MED ORDER — FLUTICASONE PROPIONATE 50 MCG/ACT NA SUSP: 2.0000 | Freq: Every day | NASAL | 0 refills | Status: AC

## 2024-03-09 MED ORDER — BENZONATATE 100 MG PO CAPS: 100.0000 mg | ORAL_CAPSULE | Freq: Three times a day (TID) | ORAL | 0 refills | Status: AC | PRN

## 2024-03-09 MED ORDER — OSELTAMIVIR PHOSPHATE 75 MG PO CAPS: 75.0000 mg | ORAL_CAPSULE | Freq: Two times a day (BID) | ORAL | 0 refills | Status: AC

## 2024-03-09 MED ORDER — NAPROXEN 500 MG PO TABS: 500.0000 mg | ORAL_TABLET | Freq: Two times a day (BID) | ORAL | 0 refills | Status: AC

## 2024-03-09 NOTE — Progress Notes (Signed)
 E visit for Flu like symptoms   We are sorry that you are not feeling well.  Here is how we plan to help! Based on what you have shared with me it looks like you may have a respiratory virus that may be influenza.  Influenza or the flu is  an infection caused by a respiratory virus. The flu virus is highly contagious and persons who did not receive their yearly flu vaccination may catch the flu from close contact.  We have anti-viral medications to treat the viruses that cause this infection. They are not a cure and only shorten the course of the infection. These prescriptions are most effective when they are given within the first 2 days of flu symptoms. Antiviral medications are indicated if you have a high risk of complications from the flu. You should  also consider an antiviral medication if you are in close contact with someone who is at risk. These medications can help patients avoid complications from the flu but have side effects that you should know.   Possible side effects from Tamiflu  or oseltamivir  include nausea, vomiting, diarrhea, dizziness, headaches, eye redness, sleep problems or other respiratory symptoms. You should not take Tamiflu  if you have an allergy to oseltamivir  or any to the ingredients in Tamiflu .  Based upon your symptoms and potential risk factors I have prescribed Oseltamivir  (Tamiflu ).  It has been sent to your designated pharmacy.  You will take one 75 mg capsule orally twice a day for the next 5 days.   For nasal congestion, you may use an oral decongestant such as Mucinex D or if you have glaucoma or high blood pressure use plain Mucinex.  Saline nasal spray or nasal drops can help and can safely be used as often as needed for congestion.  If you have a sore or scratchy throat, use a saltwater gargle-  to  teaspoon of salt dissolved in a 4-ounce to 8-ounce glass of warm water.  Gargle the solution for approximately 15-30 seconds and then spit.  It is  important not to swallow the solution.  You can also use throat lozenges/cough drops and Chloraseptic spray to help with throat pain or discomfort.  Warm or cold liquids can also be helpful in relieving throat pain.  For headache, pain or general discomfort, you can use Ibuprofen  or Tylenol  as directed.   Some authorities believe that zinc sprays or the use of Echinacea may shorten the course of your symptoms.  I have prescribed the following medications to help lessen symptoms: I have prescribed Tessalon  Perles 100 mg. You may take 1-2 capsules every 8 hours as needed for cough, I have prescribed an anti-inflammatory - Naprosyn  500 mg. Take twice daily as needed for fever or body aches for 2 weeks, and I have prescribed Fluticasone  nasal spray 2 sprays in each nostril one time per dayasal spray 2 sprays in each nostril one time per day  You are to isolate at home until you have been fever-free for at least 24 hours without a fever-reducing medication, and symptoms have been steadily improving for 24 hours.  If you must be around other household members who do not have symptoms, you need to make sure that both you and the family members are masking consistently with a high-quality mask.  If you note any worsening of symptoms despite treatment, please seek an in-person evaluation ASAP. If you note any significant shortness of breath or any chest pain, please seek ED evaluation. Please do not  delay care!  ANYONE WHO HAS FLU SYMPTOMS SHOULD: Stay home. The flu is highly contagious and going out or to work exposes others! Be sure to drink plenty of fluids. Water is fine as well as fruit juices, sodas and electrolyte beverages. You may want to stay away from caffeine or alcohol. If you are nauseated, try taking small sips of liquids. How do you know if you are getting enough fluid? Your urine should be a pale yellow or almost colorless. Get rest. Taking a steamy shower or using a humidifier may help nasal  congestion and ease sore throat pain. Using a saline nasal spray works much the same way. Cough drops, hard candies and sore throat lozenges may ease your cough. Line up a caregiver. Have someone check on you regularly.  GET HELP RIGHT AWAY IF: You cannot keep down liquids or your medications. You become short of breath Your fell like you are going to pass out or loose consciousness. Your symptoms persist after you have completed your treatment plan  MAKE SURE YOU  Understand these instructions. Will watch your condition. Will get help right away if you are not doing well or get worse.  Your e-visit answers were reviewed by a board certified advanced clinical practitioner to complete your personal care plan.  Depending on the condition, your plan could have included both over the counter or prescription medications.  If there is a problem please reply  once you have received a response from your provider.  Your safety is important to us .  If you have drug allergies check your prescription carefully.    You can use MyChart to ask questions about todays visit, request a non-urgent call back, or ask for a work or school excuse for 24 hours related to this e-Visit. If it has been greater than 24 hours you will need to follow up with your provider, or enter a new e-Visit to address those concerns.  You will get an e-mail in the next two days asking about your experience.  I hope that your e-visit has been valuable and will speed your recovery. Thank you for using e-visits.   I have spent 5 minutes in review of e-visit questionnaire, review and updating patient chart, medical decision making and response to patient.   Delon CHRISTELLA Dickinson, PA-C
